# Patient Record
Sex: Male | Born: 1940 | Race: White | Hispanic: No | Smoking: Never smoker
Health system: Southern US, Community
[De-identification: ages and names within clinical notes are randomized; demographics above are authoritative.]

## PROBLEM LIST (undated history)

## (undated) DIAGNOSIS — I10 Essential (primary) hypertension: Secondary | ICD-10-CM

## (undated) DIAGNOSIS — Z45018 Encounter for adjustment and management of other part of cardiac pacemaker: Secondary | ICD-10-CM

## (undated) DIAGNOSIS — Z87442 Personal history of urinary calculi: Secondary | ICD-10-CM

## (undated) DIAGNOSIS — M549 Dorsalgia, unspecified: Secondary | ICD-10-CM

## (undated) DIAGNOSIS — E291 Testicular hypofunction: Secondary | ICD-10-CM

## (undated) DIAGNOSIS — I509 Heart failure, unspecified: Secondary | ICD-10-CM

## (undated) DIAGNOSIS — I495 Sick sinus syndrome: Secondary | ICD-10-CM

## (undated) DIAGNOSIS — E119 Type 2 diabetes mellitus without complications: Secondary | ICD-10-CM

## (undated) DIAGNOSIS — I259 Chronic ischemic heart disease, unspecified: Secondary | ICD-10-CM

## (undated) DIAGNOSIS — I455 Other specified heart block: Secondary | ICD-10-CM

## (undated) HISTORY — DX: Dorsalgia, unspecified: M54.9

## (undated) HISTORY — DX: Other specified heart block: I45.5

## (undated) HISTORY — DX: Chronic ischemic heart disease, unspecified: I25.9

## (undated) HISTORY — DX: Heart failure, unspecified: I50.9

## (undated) HISTORY — DX: Type 2 diabetes mellitus without complications: E11.9

## (undated) HISTORY — DX: Testicular hypofunction: E29.1

## (undated) HISTORY — PX: CORONARY ARTERY BYPASS GRAFT: SHX141

## (undated) HISTORY — DX: Essential (primary) hypertension: I10

---
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
## 2010-10-12 ENCOUNTER — Inpatient Hospital Stay (HOSPITAL_COMMUNITY)
Admission: RE | Admit: 2010-10-12 | Discharge: 2010-10-25 | Payer: Self-pay | Source: Home / Self Care | Attending: Cardiothoracic Surgery | Admitting: Cardiothoracic Surgery

## 2010-10-12 ENCOUNTER — Encounter: Payer: Self-pay | Admitting: Cardiothoracic Surgery

## 2010-10-26 ENCOUNTER — Encounter: Payer: Self-pay | Admitting: Internal Medicine

## 2010-10-28 ENCOUNTER — Encounter: Payer: Self-pay | Admitting: Internal Medicine

## 2010-10-28 ENCOUNTER — Ambulatory Visit: Admission: RE | Admit: 2010-10-28 | Discharge: 2010-10-28 | Payer: Self-pay | Source: Home / Self Care

## 2010-11-12 ENCOUNTER — Encounter
Admission: RE | Admit: 2010-11-12 | Discharge: 2010-11-12 | Payer: Self-pay | Source: Home / Self Care | Attending: Cardiothoracic Surgery | Admitting: Cardiothoracic Surgery

## 2010-11-12 ENCOUNTER — Ambulatory Visit
Admission: RE | Admit: 2010-11-12 | Discharge: 2010-11-12 | Payer: Self-pay | Source: Home / Self Care | Attending: Cardiothoracic Surgery | Admitting: Cardiothoracic Surgery

## 2010-11-12 NOTE — Discharge Summary (Signed)
NAMEAUGUSTO, DECKMAN NO.:  [id number]  MEDICAL RECORD NO.:  [id number]          PATIENT TYPE:  INP  LOCATION:  2016                         FACILITY:  [facility]  PHYSICIAN:  Sheliah Plane, MD    DATE OF BIRTH:  09-18-1941  DATE OF ADMISSION:  10/12/2010 DATE OF DISCHARGE:  10/22/2010                              DISCHARGE SUMMARY   ADDENDUM:  BRIEF HOSPITAL COURSE STAY:  Since last dictation, patient remained afebrile and vital signs stable.  He was found to be volume overloaded; however, he was given Lasix 40 mg IV both on October 20, 2010, and October 21, 2010.  It was then decided that we would place him on Lasix 40 mg p.o. 2 times daily for several days and then decrease it to Lasix 40 mg p.o. daily.  In addition, spironolactone and HCTZ have been discontinued.  Chest x-ray that was done on October 20, 2010, showed small bilateral pleural effusions and bibasilar atelectasis increased on the left.  In addition, the left pleural effusion was also increased. He had stable cardiomegaly and pulmonary vascular congestion. Currently, on October 21, 2010, he is afebrile.  Heart rate is in the 60s to 70s.  BP 103/62.  O2 saturation 94% on 2 L nasal cannula overnight, however, he is on room air this morning.  Preoperative weight 99 kg, today's weight questionable at 95.8 kg.  PHYSICAL EXAMINATION:  CARDIOVASCULAR:  Regular rate and rhythm, occasionally A-paced. PULMONARY:  Crackles bilaterally. ABDOMEN:  Soft, nontender.  Bowel sounds present. EXTREMITIES:  Positive lower extremity edema bilaterally.  Wounds are all clean and dry.  He has some minor swelling at the pacemaker site.  No oozing, however.  BMET that was done today revealed the potassium to be 4.7, BUN and creatinine 20 and 0.98 respectively, and the sodium was 136.  We are going to continue with the diuresis and provided the patient remains afebrile, hemodynamically stable, and is less volume  overloaded he will be surgically stable for discharge to the SNF on October 22, 2010.  FOLLOWUP APPOINTMENTS:  Include: 1. Patient needs to contact Dr. Verl Dicker office for a followup     appointment in 2 weeks. 2. Patient has an appointment to see Dr. Johney Frame on October 28, 2010, at     2 p.m.  He also has an appointment to see him on January 21, 2011, at     9:50 a.m. 3. Patient has an appointment to see Dr. Tyrone Sage on November 11, 2010,     at 11:30 a.m. and 30 minutes prior to this office appointment a     chest x-ray will be obtained. 4. Patient needs to contact his medical doctor for further diabetes     management.  DISCHARGE MEDICATIONS AT TIME OF THIS DICTATION:  Include the following: 1. Folic acid 1 mg p.o. daily. 2. Lasix 4 mg p.o. 2 times daily for 3 days, then 40 mg p.o. daily     thereafter. 3. Lisinopril 10 mg p.o. daily. 4. Lopressor 12.5 mg p.o. 2 times daily. 5. Oxycodone 5 mg 1 to 2 tabs every 4 to 6 hours as needed for pain. 6. Enteric-coated aspirin 325 mg  p.o. daily. 7. Acarbose 100 mg p.o. 3 times daily. 8. Glipizide 10 mg p.o. 2 times daily. 9. Metformin 1000 mg p.o. 2 times daily. 10.Pravastatin 40 mg p.o. at h.s. 11.Insulin glargine.  Please note patient was taking 24 units of     insulin glargine subcu every evening.  He was instructed he should     begin around 5 units subcu every evening as long as glucose will     allow and slowly titrate his insulin glargine to his preoperative     dose.     Doree Fudge, PA   ______________________________ Sheliah Plane, MD    DZ/MEDQ  D:  10/21/2010  T:  10/21/2010  Job:  [id number]  cc:   Dr. Jacinto Halim Dr. Tyrone Sage Dr. Johney Frame  Electronically Signed by Doree Fudge PA on 11/04/2010 03:24:32 PM Electronically Signed by Sheliah Plane MD on 11/12/2010 01:51:48 PM

## 2010-11-12 NOTE — Discharge Summary (Signed)
Roger Young, Roger Young NO.:  [id number]  MEDICAL RECORD NO.:  [id number]          PATIENT TYPE:  INP  LOCATION:  2016                         FACILITY:  [facility]  PHYSICIAN:  Roger Plane, MD    DATE OF BIRTH:  08/31/1941  DATE OF ADMISSION:  10/12/2010 DATE OF DISCHARGE:                              DISCHARGE SUMMARY   PRIMARY ADMITTING DIAGNOSIS:  Shortness of breath.  ADDITIONAL/DISCHARGE DIAGNOSES: 1. Coronary artery disease. 2. History of sick sinus syndrome and second degree heart block. 3. History of sleep apnea. 4. Type 2 diabetes mellitus. 5. Hyperlipidemia. 6. Postoperative renal insufficiency.  PROCEDURES PERFORMED: 1. Cardiac catheterization. 2. Coronary artery bypass grafting x5 (left internal mammary artery to     the left anterior descending, sequential saphenous vein graft to     the first and second intermediate coronary arteries, saphenous vein     graft to the distal circumflex, saphenous vein graft to the     posterior descending). 3. Endoscopic vein harvest, right leg. 4. Insertion of dual-chamber permanent pacemaker.  HISTORY:  The patient is a 70 year old male with no previous history of coronary artery disease.  He has recently been seen by Dr. Jacinto Young with a 3- to 4-week history of increasing episodes of shortness of breath.  At times, he has had orthopnea and his symptoms have been occurring with very minimal exertion.  He denies any definitive chest pain.  He underwent an echocardiogram by Dr. Jacinto Young and was found to be in second degree heart block.  He underwent Holter monitoring in November 2011 which showed frequent PVCs and junctional escape beats and Mobitz II AV block.  Because of his symptoms and his other comorbidities with increased risk for coronary artery disease, it was felt that he should undergo cardiac catheterization to further delineate his coronary anatomy.  This was performed on October 12, 2010 by Dr. Jacinto Young.   He was found to have an 80% distal left main, 80-90% right coronary artery, 70% proximal LAD with diffuse disease of the small diagonal at 90% intermediate with 80-90% disease, a 90% obtuse marginal and well- preserved ejection fraction.  Because of his high-grade left main stenosis and multivessel disease, it was felt that he should be admitted following catheterization for surgical consideration.  HOSPITAL COURSE:  Mr. Roger Young was admitted following his cardiac catheterization and was seen in consult by Dr. Sheliah Young.  His films were also reviewed and Dr. Tyrone Young agreed with the need for coronary artery bypass grafting.  He explained all risks, benefits, and alternatives of this surgery to the patient and he agreed to proceed. He was taken to the operating room on October 13, 2010 and underwent CABG x5 as described above performed by Dr. Tyrone Young.  Please see previously dictated operative report for complete details of surgery. He tolerated the procedure well and was transferred to the SICU in stable condition.  He was able to be extubated shortly after surgery. His postoperative course has been notable for continued sinus node dysfunction.  With his history of second degree AV block prior to surgery and his postoperative sinus node dysfunction, an EP consult was obtained and the patient was seen  by Dr. Hillis Young for consideration of permanent pacemaker placement.  The patient was allowed a trial in order to assess the recovery of his intrinsic heart function.  However, he continued to have bradycardia requiring pacing as well as sinus arrest episodes and continued AV block.  It was ultimately determined that he would require permanent pacemaker and a dual-chamber Medtronic pacer was implanted on October 19, 2010 by Dr. Johney Young.  Otherwise, the patient has recovered well postoperatively.  His pacemaker has been interrogated and he has now been started on low-dose beta  blocker.  He has been restarted on his oral diabetes medications and his blood sugars are improving.  He will be restarted on low-dose Lantus at the time of discharge and this may be followed up by his primary care physician. His incisions are all healing well.  He has had some volume overload postoperatively with some elevation in his B-natriuretic peptide.  He was initially started on Lasix but his BUN and creatinine experienced a slight bump.  His peak creatinine was 1.42.  Because of this, the Lasix was discontinued as was his ACE inhibitor.  Since that time, his creatinine has trended back down to baseline and he has now been restarted on low-dose lisinopril/hydrochlorothiazide which he had taken prior to surgery.  He has also been started on Aldactone for diuresis. He is ambulating in the halls with Cardiac Rehab Phase 1 and is progressing well.  His incisions are all healing well.  He has been working with PT and OT on reconditioning.  Currently, the social worker is assisting the patient in discharge planning, short-term SNF placement versus discharge to home.  His most recent labs show a sodium of 134, potassium 4.2, BUN 19, and creatinine 0.97.  His most recent CBC showed a hemoglobin of 9.0, hematocrit 27.7, white count 13.2, and platelets 242.  Chest x-ray shows pacing device in place without complication, with small bilateral pleural effusions and basilar atelectasis slightly greater on the left.  He continues to progress well and it is felt that he will hopefully be ready for discharge within the next 24 hours.  DISCHARGE MEDICATIONS: 1. Folic acid 1 mg daily. 2. Metoprolol 12.5 mg b.i.d. 3. Oxycodone IR 5-10 mg q.4-6 h. p.r.n. for pain. 4. Spirolactone 25 mg daily. 5. Enteric-coated aspirin 325 mg daily. 6. Lantus 5 units subcu at bedtime and to be increased back to the     home dose of 24 units as able. 7. Acarbose 100 mg t.i.d. 8. Glipizide 10 mg b.i.d. 9.  Lisinopril/hydrochlorothiazide 10/12.5 mg daily. 10.Metformin 1000 mg b.i.d. 11.Pravastatin 40 mg daily.  DISCHARGE INSTRUCTIONS:  He is asked to refrain from driving, heavy lifting, or strenuous activity.  He may continue ambulating daily and using his incentive spirometer.  He may shower daily and clean his incisions with soap and water.  He will continue a carbohydrate-modified medium calorie diet.  DISCHARGE FOLLOWUP:  He will need make an appointment to see Dr. Jacinto Young in 2 weeks.  He will see Dr. Tyrone Young on November 11, 2010 with a chest x- ray from [facility].  He will also need to follow up with Dr. Johney Young as directed for pacer check.  He will also need to follow up with his primary care physician regarding his diabetes medications and treatment.     Coral Ceo, P.A.   ______________________________ Roger Plane, MD    GC/MEDQ  D:  10/20/2010  T:  10/20/2010  Job:  [id number]  cc:  Hillis Range, MD Cristy Hilts. Roger Halim, MD Aida Puffer, M.D. [facility] Office  Electronically Signed by Weldon Inches. on 10/28/2010 10:52:40 AM Electronically Signed by Roger Plane MD on 11/12/2010 01:51:41 PM

## 2010-11-12 NOTE — Discharge Summary (Signed)
  NAMEDACOTAH, CABELLO NO.:  [id number]  MEDICAL RECORD NO.:  [id number]          PATIENT TYPE:  INP  LOCATION:  2016                         FACILITY:  [facility]  PHYSICIAN:  Sheliah Plane, MD    DATE OF BIRTH:  [birth year]  DATE OF ADMISSION:  10/12/2010 DATE OF DISCHARGE:                              DISCHARGE SUMMARY   ADDENDUM  This is an addendum to a previously dictated discharge summary.  Mr. Menna was initially scheduled for transfer to Skilled Nursing Facility on October 22, 2010.  However on morning round evaluation, he was noted to still be significantly volume overloaded requiring further diuresis.  He was continued on Lasix 40 mg IV b.i.d. as well as Zaroxolyn 5 mg daily.  Over the course of the next several days, his volume overload did significantly improve.  He continues to remain edematous on physical exam but this has markedly decreased.  Also a followup chest x-ray was performed to reevaluate bilateral pleural effusions and these too have improved with diuresis.  He remained stable otherwise.  His most recent labs show a sodium of 135, potassium 3.8, BUN 23, creatinine 1.13.  His blood sugars have remained stable on oral medications alone and for this reason his Lantus has not been restarted. He was on 24 units nightly prior to admission and at the present time his blood sugars have been running in the 130s-150 range.  It was felt that he will not require his insulin at the time of discharge and this can be readdressed as an outpatient.  It is anticipated that if he remains stable over the next 24 hours, he will be ready for transfer to SNF on Monday January 2.  DISCHARGE MEDICATIONS: 1. Folic acid 1 mg daily. 2. Lasix 40 mg daily. 3. Potassium 20 mEq daily. 4. Combivent 2 puffs q.6 h. 5. Lisinopril 10 mg daily. 6. Lopressor 12.5 mg b.i.d. 7. Oxycodone IR 5 mg 1-2 q.4-6 h. p.r.n. for pain. 8. Enteric-coated aspirin 325 mg  daily. 9. Acarbose 200 mg t.i.d. 10.Glipizide 10 mg b.i.d. 11.Metformin 1000 mg b.i.d. 12.Pravastatin 40 mg daily.  Discharge instructions and followup appointments are unchanged from the previously dictated discharge summary.     Coral Ceo, P.A.   ______________________________ Sheliah Plane, MD    GC/MEDQ  D:  10/24/2010  T:  10/25/2010  Job:  [id number]  cc:   [facility] OFFICE Cristy Hilts. Jacinto Halim, MD Hillis Range, MD  Electronically Signed by Weldon Inches. on 10/28/2010 10:53:31 AM Electronically Signed by Sheliah Plane MD on 11/12/2010 01:51:45 PM

## 2010-11-13 NOTE — Assessment & Plan Note (Signed)
OFFICE VISIT  EDDER, BELLANCA DOB:  03-15-1941                                        November 12, 2010 CHART #:  [id number]  Mr. Lucarelli returns to the office today in followup after his urgent coronary artery bypass grafting on October 13, 2010 and subsequent placement of a permanent pacemaker.  Overall he is making very good progress postoperatively, he has increased his physical activity appropriately.  He has had no recurrent angina.  He notes that his overall respiratory status is much improved.  He denies the nocturnal dyspnea that he was previously noticing.  On exam, his blood pressure 115/72, pulse 80, respiratory rate is 20, and O2 sats 98%.  His sternum is stable and well healed.  Bowel sounds are crisp.  The left pacer site is well healed without evidence of infection or drainage.  He has very mild right calf tenderness at the endo-vein harvest site, none on the left.  He has no pedal edema.  Followup chest x-ray shows clear lung fields bilaterally.  His medications have changed slightly.  His Dr. Jacinto Halim was placed him on Plavix 75 mg a day and decreased his aspirin to 81 mg a day.  He continues on Lasix, potassium, Combivent, lisinopril, Lopressor, acarbose 200 b.i.d., glyburide, metformin, and pravastatin.  Overall I am very pleased with his progress.  I have not made him return appointment to see me but would be glad to see him at his or Dr. Verl Dicker request at anytime.  Sheliah Plane, MD Electronically Signed  EG/MEDQ  D:  11/12/2010  T:  11/13/2010  Job:  [id number]  cc:   Cristy Hilts. Jacinto Halim, MD Aida Puffer

## 2010-11-24 ENCOUNTER — Encounter: Payer: Self-pay | Admitting: Cardiothoracic Surgery

## 2010-11-25 NOTE — Procedures (Signed)
Summary: wch. gd   Current Medications (verified): 1)  Folic Acid 1 Mg Tabs (Folic Acid) .... One By Mouth Daily 2)  Metoprolol Succinate 25 Mg Xr24h-Tab (Metoprolol Succinate) .... One By Mouth Daily 3)  Ecotrin 325 Mg Tbec (Aspirin) .... One By Mouth Daily 4)  Acarbose 100 Mg Tabs (Acarbose) .... One By Mouth Three Times A Day 5)  Glipizide Xl 10 Mg Xr24h-Tab (Glipizide) .... One By Mouth Two Times A Day 6)  Metformin Hcl 1000 Mg Tabs (Metformin Hcl) .... One By Mouth Two Times A Day 7)  Pravastatin Sodium 40 Mg Tabs (Pravastatin Sodium) .... One By Mouth Daily 8)  Lasix 40 Mg Tabs (Furosemide) .... One By Mouth Daily 9)  Klor-Con 20 Meq Cr-Tabs (Potassium Chloride) .... One By Mouth Daily 10)  Prinivil 10 Mg Tabs (Lisinopril) .... One By Mouth Daily 11)  Combivent 18-103 Mcg/act Aero (Ipratropium-Albuterol) .... 2 Puffs Every 6 Hours  Allergies (verified): No Known Drug Allergies  PPM Specifications Following MD:  Hillis Range, MD     PPM Vendor:  Medtronic     PPM Model Number:  [facility]     PPM Serial Number:  [id number] PPM DOI:  10/19/2010     PPM Implanting MD:  Hillis Range, MD  Lead 1    Location: RA     DOI: 10/19/2010     Model #: 9147     Serial #: [id number]     Status: active Lead 2    Location: RV     DOI: 10/19/2010     Model #: 6578     Serial #: [id number]     Status: active  Magnet Response Rate:  BOL 85 ERI 65  Indications:  Sick sinus syndrome   PPM Follow Up Remote Check?  No Battery Voltage:  2.78 V     Battery Est. Longevity:  13 years     Pacer Dependent:  No       PPM Device Measurements [employer]rium  Amplitude: 4.0 mV, Impedance: 558 ohms, Threshold: 0.5 V at 0.4 msec Right Ventricle  Amplitude: 22.40 mV, Impedance: 657 ohms, Threshold: 0.25 V at 0.4 msec  Episodes MS Episodes:  0     Percent Mode Switch:  0     Coumadin:  No Ventricular High Rate:  0     Atrial Pacing:  17.7%     Ventricular Pacing:  0.6%  Parameters Mode:  DDDR+     Lower Rate Limit:   60     Upper Rate Limit:  120 Paced AV Delay:  180     Sensed AV Delay:  150 Next Cardiology Appt Due:  01/21/2011 Tech Comments:  Steri strips removed, no redness but a moderate amt. of edema noted.  Mr. Daubert  was on Lovenox in the hospital.  No parameter changes.  Device function normal.  ROV 3 months with Dr. Johney Frame. Altha Harm, LPN  October 28, 2010 2:38 PM

## 2010-11-25 NOTE — Miscellaneous (Signed)
Summary: Device preload  Clinical Lists Changes  Observations: Added new observation of PPM INDICATN: Sick sinus syndrome (10/26/2010 14:02) Added new observation of MAGNET RTE: BOL 85 ERI 65 (10/26/2010 14:02) Added new observation of PPMLEADSTAT2: active (10/26/2010 14:02) Added new observation of PPMLEADSER2: [id number] (10/26/2010 14:02) Added new observation of PPMLEADMOD2: 5076  (10/26/2010 14:02) Added new observation of PPMLEADLOC2: RV  (10/26/2010 14:02) Added new observation of PPMLEADSTAT1: active  (10/26/2010 14:02) Added new observation of PPMLEADSER1: [id number]  (10/26/2010 14:02) Added new observation of PPMLEADMOD1: 5076  (10/26/2010 14:02) Added new observation of PPMLEADLOC1: RA  (10/26/2010 14:02) Added new observation of PPM IMP MD: Hillis Range, MD  (10/26/2010 14:02) Added new observation of PPMLEADDOI2: 10/19/2010  (10/26/2010 14:02) Added new observation of PPMLEADDOI1: 10/19/2010  (10/26/2010 14:02) Added new observation of PPM DOI: 10/19/2010  (10/26/2010 14:02) Added new observation of PPM SERL#: [id number]  (10/26/2010 14:02) Added new observation of PPM MODL#: [id number]  (10/26/2010 14:02) Added new observation of PACEMAKERMFG: Medtronic  (10/26/2010 14:02) Added new observation of PACEMAKER MD: Hillis Range, MD  (10/26/2010 14:02)      PPM Specifications Following MD:  Hillis Range, MD     PPM Vendor:  Medtronic     PPM Model Number:  [id number]     PPM Serial Number:  [id number] PPM DOI:  10/19/2010     PPM Implanting MD:  Hillis Range, MD  Lead 1    Location: RA     DOI: 10/19/2010     Model #: [id number]     Serial #: [id number]     Status: active Lead 2    Location: RV     DOI: 10/19/2010     Model #: [id number]     Serial #: [id number]     Status: active  Magnet Response Rate:  BOL 85 ERI 65  Indications:  Sick sinus syndrome

## 2010-11-25 NOTE — Cardiovascular Report (Signed)
Summary: Office Visit   Office Visit   Imported By: Roderic Ovens 11/02/2010 14:05:02  _____________________________________________________________________  External Attachment:    Type:   Image     Comment:   External Document

## 2010-12-31 ENCOUNTER — Ambulatory Visit (INDEPENDENT_AMBULATORY_CARE_PROVIDER_SITE_OTHER): Payer: Self-pay | Admitting: Cardiothoracic Surgery

## 2010-12-31 DIAGNOSIS — I2510 Atherosclerotic heart disease of native coronary artery without angina pectoris: Secondary | ICD-10-CM

## 2011-01-01 NOTE — Assessment & Plan Note (Signed)
OFFICE VISIT  Roger Young, Roger Young DOB:  1941-02-12                                        December 31, 2010 CHART #:  [id number]  CURRENT PROBLEMS: 1. Status post coronary artery bypass graft x5, October 13, 2010, by     Dr. Tyrone Sage for severe left main and multivessel coronary artery     disease. 2. Mobitz II heart block following surgery requiring permanent     pacemaker. 3. Cellulitis of the upper portion the sternal incision. 4. 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  PRESENT ILLNESS:  The patient reports the office today over concern of his sternal incision and itching rash over his right lower leg.  He is back to work after multivessel bypass grafting in late December by Dr. Tyrone Sage.  He is having no angina, and he has no symptoms of CHF.  His medications have been adjusted by Dr. Jacinto Halim and currently, include lisinopril, Lopressor, aspirin, metformin, pravastatin, Plavix, and he has not been given any antibiotics.  PHYSICAL EXAMINATION:  VITAL SIGNS:  Blood pressure 150/80, pulse 80, saturation 90%, temperature 97.2.  GENERAL:  He is alert and pleasant. Breath sounds are clear.  The sternum is stable.  HEART:  Rhythm is regular without rub or gallop.  The upper portion of the sternal incision is intact, but there is a dry eschar and some mild surrounding erythema and induration.  This is mildly tender.  There is no fluctuance, drainage, or evidence of a space underneath the skin.  This is very small and measures 1 cm x 1 cm.  The lower incision has no problems.  EXTREMITIES:  The lower leg incision over the vein was harvested above the ankle has some eczema - rash.  There is no edema.  PLAN:  The patient probably has mild cellulitis or suture granuloma in the sternal incision.  He will be given a course of oral Keflex.  He has a rash - eczema of his lower leg, and he will be given some topical steroid cream.  He is also complaining  of generalized pruritus or itching, and I gave him a short prescription for some Atarax.  He will return to see Dr. Tyrone Sage in 2-3 weeks after he finishes course of antibiotics for this probable suture granuloma with some mild surrounding cellulitis.  Kerin Perna, M.D. Electronically Signed  PV/MEDQ  D:  12/31/2010  T:  01/01/2011  Job:  [id number]  cc:   Cristy Hilts. Jacinto Halim, MD

## 2011-01-03 LAB — GLUCOSE, CAPILLARY
Glucose-Capillary: 100 mg/dL — ABNORMAL HIGH (ref 70–99)
Glucose-Capillary: 101 mg/dL — ABNORMAL HIGH (ref 70–99)
Glucose-Capillary: 102 mg/dL — ABNORMAL HIGH (ref 70–99)
Glucose-Capillary: 105 mg/dL — ABNORMAL HIGH (ref 70–99)
Glucose-Capillary: 106 mg/dL — ABNORMAL HIGH (ref 70–99)
Glucose-Capillary: 111 mg/dL — ABNORMAL HIGH (ref 70–99)
Glucose-Capillary: 111 mg/dL — ABNORMAL HIGH (ref 70–99)
Glucose-Capillary: 115 mg/dL — ABNORMAL HIGH (ref 70–99)
Glucose-Capillary: 115 mg/dL — ABNORMAL HIGH (ref 70–99)
Glucose-Capillary: 118 mg/dL — ABNORMAL HIGH (ref 70–99)
Glucose-Capillary: 118 mg/dL — ABNORMAL HIGH (ref 70–99)
Glucose-Capillary: 119 mg/dL — ABNORMAL HIGH (ref 70–99)
Glucose-Capillary: 120 mg/dL — ABNORMAL HIGH (ref 70–99)
Glucose-Capillary: 122 mg/dL — ABNORMAL HIGH (ref 70–99)
Glucose-Capillary: 128 mg/dL — ABNORMAL HIGH (ref 70–99)
Glucose-Capillary: 133 mg/dL — ABNORMAL HIGH (ref 70–99)
Glucose-Capillary: 138 mg/dL — ABNORMAL HIGH (ref 70–99)
Glucose-Capillary: 143 mg/dL — ABNORMAL HIGH (ref 70–99)
Glucose-Capillary: 146 mg/dL — ABNORMAL HIGH (ref 70–99)
Glucose-Capillary: 146 mg/dL — ABNORMAL HIGH (ref 70–99)
Glucose-Capillary: 146 mg/dL — ABNORMAL HIGH (ref 70–99)
Glucose-Capillary: 151 mg/dL — ABNORMAL HIGH (ref 70–99)
Glucose-Capillary: 151 mg/dL — ABNORMAL HIGH (ref 70–99)
Glucose-Capillary: 153 mg/dL — ABNORMAL HIGH (ref 70–99)
Glucose-Capillary: 154 mg/dL — ABNORMAL HIGH (ref 70–99)
Glucose-Capillary: 155 mg/dL — ABNORMAL HIGH (ref 70–99)
Glucose-Capillary: 156 mg/dL — ABNORMAL HIGH (ref 70–99)
Glucose-Capillary: 188 mg/dL — ABNORMAL HIGH (ref 70–99)
Glucose-Capillary: 210 mg/dL — ABNORMAL HIGH (ref 70–99)
Glucose-Capillary: 216 mg/dL — ABNORMAL HIGH (ref 70–99)
Glucose-Capillary: 216 mg/dL — ABNORMAL HIGH (ref 70–99)
Glucose-Capillary: 65 mg/dL — ABNORMAL LOW (ref 70–99)
Glucose-Capillary: 67 mg/dL — ABNORMAL LOW (ref 70–99)
Glucose-Capillary: 74 mg/dL (ref 70–99)
Glucose-Capillary: 81 mg/dL (ref 70–99)
Glucose-Capillary: 92 mg/dL (ref 70–99)

## 2011-01-03 LAB — POCT I-STAT 3, ART BLOOD GAS (G3+)
Acid-base deficit: 5 mmol/L — ABNORMAL HIGH (ref 0.0–2.0)
Bicarbonate: 19.5 mEq/L — ABNORMAL LOW (ref 20.0–24.0)
Bicarbonate: 23.8 mEq/L (ref 20.0–24.0)
Bicarbonate: 25.3 mEq/L — ABNORMAL HIGH (ref 20.0–24.0)
Bicarbonate: 27.3 mEq/L — ABNORMAL HIGH (ref 20.0–24.0)
O2 Saturation: 100 %
O2 Saturation: 79 %
O2 Saturation: 96 %
Patient temperature: 37.5
TCO2: 25 mmol/L (ref 0–100)
TCO2: 29 mmol/L (ref 0–100)
pCO2 arterial: 41.3 mmHg (ref 35.0–45.0)
pCO2 arterial: 44.6 mmHg (ref 35.0–45.0)
pCO2 arterial: 46.5 mmHg — ABNORMAL HIGH (ref 35.0–45.0)
pCO2 arterial: 48.4 mmHg — ABNORMAL HIGH (ref 35.0–45.0)
pH, Arterial: 7.307 — ABNORMAL LOW (ref 7.350–7.450)
pH, Arterial: 7.334 — ABNORMAL LOW (ref 7.350–7.450)
pH, Arterial: 7.359 (ref 7.350–7.450)
pH, Arterial: 7.359 (ref 7.350–7.450)
pO2, Arterial: 344 mmHg — ABNORMAL HIGH (ref 80.0–100.0)
pO2, Arterial: 368 mmHg — ABNORMAL HIGH (ref 80.0–100.0)
pO2, Arterial: 43 mmHg — ABNORMAL LOW (ref 80.0–100.0)
pO2, Arterial: 89 mmHg (ref 80.0–100.0)

## 2011-01-03 LAB — COMPREHENSIVE METABOLIC PANEL
ALT: 18 U/L (ref 0–53)
AST: 26 U/L (ref 0–37)
Albumin: 3.4 g/dL — ABNORMAL LOW (ref 3.5–5.2)
BUN: 26 mg/dL — ABNORMAL HIGH (ref 6–23)
GFR calc Af Amer: >60 mL/min (ref >60)
GFR calc non Af Amer: >60 mL/min (ref >60)
Glucose, Bld: 189 mg/dL — ABNORMAL HIGH (ref 70–99)
Sodium: 136 mEq/L (ref 135–145)

## 2011-01-03 LAB — BASIC METABOLIC PANEL
BUN: 19 mg/dL (ref 6–23)
BUN: 19 mg/dL (ref 6–23)
BUN: 20 mg/dL (ref 6–23)
BUN: 20 mg/dL (ref 6–23)
BUN: 22 mg/dL (ref 6–23)
BUN: 22 mg/dL (ref 6–23)
BUN: 23 mg/dL (ref 6–23)
BUN: 23 mg/dL (ref 6–23)
BUN: 27 mg/dL — ABNORMAL HIGH (ref 6–23)
BUN: 29 mg/dL — ABNORMAL HIGH (ref 6–23)
BUN: 35 mg/dL — ABNORMAL HIGH (ref 6–23)
BUN: 36 mg/dL — ABNORMAL HIGH (ref 6–23)
CO2: 22 mEq/L (ref 19–32)
CO2: 23 mEq/L (ref 19–32)
CO2: 25 mEq/L (ref 19–32)
CO2: 26 mEq/L (ref 19–32)
CO2: 27 mEq/L (ref 19–32)
CO2: 27 mEq/L (ref 19–32)
CO2: 27 mEq/L (ref 19–32)
CO2: 27 mEq/L (ref 19–32)
CO2: 27 mEq/L (ref 19–32)
CO2: 28 mEq/L (ref 19–32)
CO2: 28 mEq/L (ref 19–32)
CO2: 29 mEq/L (ref 19–32)
Calcium: 8.7 mg/dL (ref 8.4–10.5)
Calcium: 8.8 mg/dL (ref 8.4–10.5)
Calcium: 8.9 mg/dL (ref 8.4–10.5)
Calcium: 9 mg/dL (ref 8.4–10.5)
Calcium: 9 mg/dL (ref 8.4–10.5)
Calcium: 9.1 mg/dL (ref 8.4–10.5)
Calcium: 9.3 mg/dL (ref 8.4–10.5)
Calcium: 9.3 mg/dL (ref 8.4–10.5)
Calcium: 9.4 mg/dL (ref 8.4–10.5)
Calcium: 9.4 mg/dL (ref 8.4–10.5)
Calcium: 9.5 mg/dL (ref 8.4–10.5)
Chloride: 100 mEq/L (ref 96–112)
Chloride: 105 mEq/L (ref 96–112)
Chloride: 94 mEq/L — ABNORMAL LOW (ref 96–112)
Chloride: 99 mEq/L (ref 96–112)
Chloride: 99 mEq/L (ref 96–112)
Chloride: 99 mEq/L (ref 96–112)
Chloride: 99 mEq/L (ref 96–112)
Chloride: 99 mEq/L (ref 96–112)
Chloride: 99 mEq/L (ref 96–112)
Creatinine, Ser: 0.84 mg/dL (ref 0.4–1.5)
Creatinine, Ser: 0.87 mg/dL (ref 0.4–1.5)
Creatinine, Ser: 0.97 mg/dL (ref 0.4–1.5)
Creatinine, Ser: 0.98 mg/dL (ref 0.4–1.5)
Creatinine, Ser: 1.13 mg/dL (ref 0.4–1.5)
Creatinine, Ser: 1.14 mg/dL (ref 0.4–1.5)
Creatinine, Ser: 1.17 mg/dL (ref 0.4–1.5)
Creatinine, Ser: 1.17 mg/dL (ref 0.4–1.5)
Creatinine, Ser: 1.19 mg/dL (ref 0.4–1.5)
Creatinine, Ser: 1.32 mg/dL (ref 0.4–1.5)
Creatinine, Ser: 1.4 mg/dL (ref 0.4–1.5)
Creatinine, Ser: 1.42 mg/dL (ref 0.4–1.5)
GFR calc Af Amer: 60 mL/min — ABNORMAL LOW (ref >60)
GFR calc Af Amer: >60 mL/min (ref >60)
GFR calc Af Amer: >60 mL/min (ref >60)
GFR calc Af Amer: >60 mL/min (ref >60)
GFR calc Af Amer: >60 mL/min (ref >60)
GFR calc Af Amer: >60 mL/min (ref >60)
GFR calc Af Amer: >60 mL/min (ref >60)
GFR calc Af Amer: >60 mL/min (ref >60)
GFR calc Af Amer: >60 mL/min (ref >60)
GFR calc Af Amer: >60 mL/min (ref >60)
GFR calc Af Amer: >60 mL/min (ref >60)
GFR calc non Af Amer: 50 mL/min — ABNORMAL LOW (ref >60)
GFR calc non Af Amer: 50 mL/min — ABNORMAL LOW (ref >60)
GFR calc non Af Amer: 54 mL/min — ABNORMAL LOW (ref >60)
GFR calc non Af Amer: >60 mL/min (ref >60)
GFR calc non Af Amer: >60 mL/min (ref >60)
GFR calc non Af Amer: >60 mL/min (ref >60)
GFR calc non Af Amer: >60 mL/min (ref >60)
GFR calc non Af Amer: >60 mL/min (ref >60)
GFR calc non Af Amer: >60 mL/min (ref >60)
GFR calc non Af Amer: >60 mL/min (ref >60)
GFR calc non Af Amer: >60 mL/min (ref >60)
GFR calc non Af Amer: >60 mL/min (ref >60)
GFR calc non Af Amer: >60 mL/min (ref >60)
Glucose, Bld: 115 mg/dL — ABNORMAL HIGH (ref 70–99)
Glucose, Bld: 119 mg/dL — ABNORMAL HIGH (ref 70–99)
Glucose, Bld: 121 mg/dL — ABNORMAL HIGH (ref 70–99)
Glucose, Bld: 126 mg/dL — ABNORMAL HIGH (ref 70–99)
Glucose, Bld: 138 mg/dL — ABNORMAL HIGH (ref 70–99)
Glucose, Bld: 142 mg/dL — ABNORMAL HIGH (ref 70–99)
Glucose, Bld: 153 mg/dL — ABNORMAL HIGH (ref 70–99)
Glucose, Bld: 158 mg/dL — ABNORMAL HIGH (ref 70–99)
Glucose, Bld: 164 mg/dL — ABNORMAL HIGH (ref 70–99)
Glucose, Bld: 172 mg/dL — ABNORMAL HIGH (ref 70–99)
Glucose, Bld: 71 mg/dL (ref 70–99)
Glucose, Bld: 83 mg/dL (ref 70–99)
Glucose, Bld: 84 mg/dL (ref 70–99)
Potassium: 3.8 mEq/L (ref 3.5–5.1)
Potassium: 3.9 mEq/L (ref 3.5–5.1)
Potassium: 3.9 mEq/L (ref 3.5–5.1)
Potassium: 4.5 mEq/L (ref 3.5–5.1)
Potassium: 4.6 mEq/L (ref 3.5–5.1)
Potassium: 4.6 mEq/L (ref 3.5–5.1)
Potassium: 4.7 mEq/L (ref 3.5–5.1)
Potassium: 4.7 mEq/L (ref 3.5–5.1)
Potassium: 5.6 mEq/L — ABNORMAL HIGH (ref 3.5–5.1)
Sodium: 130 mEq/L — ABNORMAL LOW (ref 135–145)
Sodium: 130 mEq/L — ABNORMAL LOW (ref 135–145)
Sodium: 130 mEq/L — ABNORMAL LOW (ref 135–145)
Sodium: 132 mEq/L — ABNORMAL LOW (ref 135–145)
Sodium: 133 mEq/L — ABNORMAL LOW (ref 135–145)
Sodium: 134 mEq/L — ABNORMAL LOW (ref 135–145)
Sodium: 135 mEq/L (ref 135–145)
Sodium: 136 mEq/L (ref 135–145)
Sodium: 136 mEq/L (ref 135–145)
Sodium: 137 mEq/L (ref 135–145)

## 2011-01-03 LAB — CBC
HCT: 26 % — ABNORMAL LOW (ref 39.0–52.0)
HCT: 27.7 % — ABNORMAL LOW (ref 39.0–52.0)
HCT: 27.7 % — ABNORMAL LOW (ref 39.0–52.0)
HCT: 27.9 % — ABNORMAL LOW (ref 39.0–52.0)
HCT: 28.1 % — ABNORMAL LOW (ref 39.0–52.0)
HCT: 28.9 % — ABNORMAL LOW (ref 39.0–52.0)
HCT: 32.8 % — ABNORMAL LOW (ref 39.0–52.0)
HCT: 37.7 % — ABNORMAL LOW (ref 39.0–52.0)
Hemoglobin: 10.8 g/dL — ABNORMAL LOW (ref 13.0–17.0)
Hemoglobin: 12.8 g/dL — ABNORMAL LOW (ref 13.0–17.0)
Hemoglobin: 12.9 g/dL — ABNORMAL LOW (ref 13.0–17.0)
Hemoglobin: 8.7 g/dL — ABNORMAL LOW (ref 13.0–17.0)
Hemoglobin: 9 g/dL — ABNORMAL LOW (ref 13.0–17.0)
Hemoglobin: 9.2 g/dL — ABNORMAL LOW (ref 13.0–17.0)
Hemoglobin: 9.2 g/dL — ABNORMAL LOW (ref 13.0–17.0)
Hemoglobin: 9.3 g/dL — ABNORMAL LOW (ref 13.0–17.0)
Hemoglobin: 9.8 g/dL — ABNORMAL LOW (ref 13.0–17.0)
MCH: 28 pg (ref 26.0–34.0)
MCH: 28.2 pg (ref 26.0–34.0)
MCH: 28.5 pg (ref 26.0–34.0)
MCH: 28.6 pg (ref 26.0–34.0)
MCH: 28.6 pg (ref 26.0–34.0)
MCH: 28.7 pg (ref 26.0–34.0)
MCH: 28.8 pg (ref 26.0–34.0)
MCH: 29.4 pg (ref 26.0–34.0)
MCHC: 32.5 g/dL (ref 30.0–36.0)
MCHC: 32.9 g/dL (ref 30.0–36.0)
MCHC: 33 g/dL (ref 30.0–36.0)
MCHC: 33.1 g/dL (ref 30.0–36.0)
MCHC: 33.1 g/dL (ref 30.0–36.0)
MCHC: 33.2 g/dL (ref 30.0–36.0)
MCHC: 33.5 g/dL (ref 30.0–36.0)
MCHC: 33.9 g/dL (ref 30.0–36.0)
MCHC: 34 g/dL (ref 30.0–36.0)
MCV: 85 fL (ref 78.0–100.0)
MCV: 85.5 fL (ref 78.0–100.0)
MCV: 85.6 fL (ref 78.0–100.0)
MCV: 86 fL (ref 78.0–100.0)
MCV: 86 fL (ref 78.0–100.0)
MCV: 86.2 fL (ref 78.0–100.0)
MCV: 86.5 fL (ref 78.0–100.0)
MCV: 86.9 fL (ref 78.0–100.0)
Platelets: 146 10*3/uL — ABNORMAL LOW (ref 150–400)
Platelets: 152 10*3/uL (ref 150–400)
Platelets: 159 10*3/uL (ref 150–400)
Platelets: 167 10*3/uL (ref 150–400)
Platelets: 169 10*3/uL (ref 150–400)
Platelets: 186 10*3/uL (ref 150–400)
Platelets: 242 10*3/uL (ref 150–400)
Platelets: 253 10*3/uL (ref 150–400)
RBC: 3.04 MIL/uL — ABNORMAL LOW (ref 4.22–5.81)
RBC: 3.21 MIL/uL — ABNORMAL LOW (ref 4.22–5.81)
RBC: 3.22 MIL/uL — ABNORMAL LOW (ref 4.22–5.81)
RBC: 3.22 MIL/uL — ABNORMAL LOW (ref 4.22–5.81)
RBC: 3.26 MIL/uL — ABNORMAL LOW (ref 4.22–5.81)
RBC: 3.4 MIL/uL — ABNORMAL LOW (ref 4.22–5.81)
RBC: 3.83 MIL/uL — ABNORMAL LOW (ref 4.22–5.81)
RDW: 13 % (ref 11.5–15.5)
RDW: 13 % (ref 11.5–15.5)
RDW: 13.2 % (ref 11.5–15.5)
RDW: 13.4 % (ref 11.5–15.5)
RDW: 13.4 % (ref 11.5–15.5)
RDW: 13.5 % (ref 11.5–15.5)
RDW: 13.6 % (ref 11.5–15.5)
RDW: 13.6 % (ref 11.5–15.5)
WBC: 12.5 10*3/uL — ABNORMAL HIGH (ref 4.0–10.5)
WBC: 13.2 10*3/uL — ABNORMAL HIGH (ref 4.0–10.5)
WBC: 13.2 10*3/uL — ABNORMAL HIGH (ref 4.0–10.5)
WBC: 14.5 10*3/uL — ABNORMAL HIGH (ref 4.0–10.5)
WBC: 15.5 10*3/uL — ABNORMAL HIGH (ref 4.0–10.5)
WBC: 16.4 10*3/uL — ABNORMAL HIGH (ref 4.0–10.5)
WBC: 18.7 10*3/uL — ABNORMAL HIGH (ref 4.0–10.5)
WBC: 9.2 10*3/uL (ref 4.0–10.5)

## 2011-01-03 LAB — POCT I-STAT, CHEM 8
BUN: 23 mg/dL (ref 6–23)
Calcium, Ion: 1.25 mmol/L (ref 1.12–1.32)
Calcium, Ion: 1.3 mmol/L (ref 1.12–1.32)
Chloride: 105 mEq/L (ref 96–112)
Chloride: 99 mEq/L (ref 96–112)
Creatinine, Ser: 1.1 mg/dL (ref 0.4–1.5)
Glucose, Bld: 103 mg/dL — ABNORMAL HIGH (ref 70–99)
Potassium: 5 mEq/L (ref 3.5–5.1)

## 2011-01-03 LAB — T4, FREE: Free T4: 0.91 ng/dL (ref 0.80–1.80)

## 2011-01-03 LAB — BRAIN NATRIURETIC PEPTIDE: Pro B Natriuretic peptide (BNP): 326 pg/mL — ABNORMAL HIGH (ref 0.0–100.0)

## 2011-01-03 LAB — PLATELET FUNCTION ASSAY: Collagen / Epinephrine: 165 seconds (ref 0–184)

## 2011-01-03 LAB — TSH: TSH: 2.958 u[IU]/mL (ref 0.350–4.500)

## 2011-01-03 LAB — POCT I-STAT 4, (NA,K, GLUC, HGB,HCT)
Glucose, Bld: 130 mg/dL — ABNORMAL HIGH (ref 70–99)
Glucose, Bld: 84 mg/dL (ref 70–99)
Glucose, Bld: 90 mg/dL (ref 70–99)
Glucose, Bld: 97 mg/dL (ref 70–99)
HCT: 28 % — ABNORMAL LOW (ref 39.0–52.0)
HCT: 33 % — ABNORMAL LOW (ref 39.0–52.0)
Hemoglobin: 10.9 g/dL — ABNORMAL LOW (ref 13.0–17.0)
Hemoglobin: 11.2 g/dL — ABNORMAL LOW (ref 13.0–17.0)
Hemoglobin: 12.9 g/dL — ABNORMAL LOW (ref 13.0–17.0)
Potassium: 3.9 mEq/L (ref 3.5–5.1)
Potassium: 4.3 mEq/L (ref 3.5–5.1)
Potassium: 4.4 mEq/L (ref 3.5–5.1)
Sodium: 135 mEq/L (ref 135–145)
Sodium: 137 mEq/L (ref 135–145)
Sodium: 138 mEq/L (ref 135–145)

## 2011-01-03 LAB — URINALYSIS, ROUTINE W REFLEX MICROSCOPIC
Bilirubin Urine: NEGATIVE
Hgb urine dipstick: NEGATIVE
Ketones, ur: NEGATIVE mg/dL
Protein, ur: NEGATIVE mg/dL
Specific Gravity, Urine: 1.019 (ref 1.005–1.030)
pH: 5.5 (ref 5.0–8.0)

## 2011-01-03 LAB — TYPE AND SCREEN
ABO/RH(D): A POS
Antibody Screen: NEGATIVE

## 2011-01-03 LAB — BLOOD GAS, ARTERIAL
Acid-base deficit: 0.8 mmol/L (ref 0.0–2.0)
Patient temperature: 98.6
TCO2: 24 mmol/L (ref 0–100)
pCO2 arterial: 34.9 mmHg — ABNORMAL LOW (ref 35.0–45.0)
pH, Arterial: 7.433 (ref 7.350–7.450)

## 2011-01-03 LAB — HEMOGLOBIN A1C
Hgb A1c MFr Bld: 7.5 % — ABNORMAL HIGH (ref <5.7)
Hgb A1c MFr Bld: 7.5 % — ABNORMAL HIGH (ref <5.7)
Mean Plasma Glucose: 169 mg/dL — ABNORMAL HIGH (ref <117)

## 2011-01-03 LAB — MAGNESIUM
Magnesium: 2 mg/dL (ref 1.5–2.5)
Magnesium: 2.3 mg/dL (ref 1.5–2.5)
Magnesium: 2.6 mg/dL — ABNORMAL HIGH (ref 1.5–2.5)

## 2011-01-03 LAB — PROTIME-INR
INR: 1.45 (ref 0.00–1.49)
Prothrombin Time: 17.8 seconds — ABNORMAL HIGH (ref 11.6–15.2)

## 2011-01-03 LAB — MRSA PCR SCREENING: MRSA by PCR: NEGATIVE

## 2011-01-03 LAB — APTT: aPTT: 35 seconds (ref 24–37)

## 2011-01-03 LAB — CREATININE, SERUM
Creatinine, Ser: 1.01 mg/dL (ref 0.4–1.5)
GFR calc Af Amer: >60 mL/min (ref >60)
GFR calc non Af Amer: >60 mL/min (ref >60)

## 2011-01-03 LAB — HEMOGLOBIN AND HEMATOCRIT, BLOOD
HCT: 28 % — ABNORMAL LOW (ref 39.0–52.0)
Hemoglobin: 9.4 g/dL — ABNORMAL LOW (ref 13.0–17.0)

## 2011-01-03 LAB — PLATELET COUNT: Platelets: 183 10*3/uL (ref 150–400)

## 2011-01-20 ENCOUNTER — Encounter (INDEPENDENT_AMBULATORY_CARE_PROVIDER_SITE_OTHER): Payer: Medicare Other | Admitting: Cardiothoracic Surgery

## 2011-01-20 ENCOUNTER — Encounter: Payer: Medicare Other | Admitting: Cardiothoracic Surgery

## 2011-01-20 DIAGNOSIS — I2510 Atherosclerotic heart disease of native coronary artery without angina pectoris: Secondary | ICD-10-CM

## 2011-01-20 NOTE — Assessment & Plan Note (Signed)
OFFICE VISIT  Roger Young, Roger Young DOB:  01-17-1941                                        January 20, 2011 CHART #:  [id number]  The patient returns to the office today.  He was originally underwent coronary artery bypass grafting x5 on October 13, 2010.  He came back in early March and saw Dr. Donata Clay, because of itching and diffuse pruritus and area of cellulitis in upper portion of the sternal incision.  He was treated with Keflex and he was also given Atarax cream for the itching, this is all improved and he returns tot the office for followup check of his wound.  He does note that his last hemoglobin A1c was 5.3, but since then has had numerous changes in his diabetic medication and notes his glucoses now running higher.  He also says decided to stop taking Plavix because he ran out of it and switch as prescribed by Dr. Jacinto Halim.  At the time, he was on aspirin 81 mg and since running out of the Plavix, he is returned to one full adult aspirin 325. He has had no evidence of congestive heart failure or angina.  On exam, his blood pressure 141/86, pulse 76, respiratory rate is 20, and O2 sats 98%.  His sternum is stable and well healed.  The area to the upper portion of his sternum is without any evidence of infection or erythema at this point.  His lower extremities are without any swelling or and the areas of excoriation and itching have resolved.  His current medications include; 1. Lisinopril 10 mg a day. 2. Lopressor 12.5 b.i.d. 3. Aspirin 325 a day. 4. Metformin 1000 mg p.o. b.i.d. 5. Pravastatin 40 mg a day. 6. Insulin, he does not know the doses or types.  I have asked him to contact Dr. Verl Dicker Office about the discontinuation of the Plavix and he continues to see Dr. Clarene Duke for diabetic control.  Sheliah Plane, MD Electronically Signed  EG/MEDQ  D:  01/20/2011  T:  01/20/2011  Job:  [id number]  cc:   Cristy Hilts. Jacinto Halim, MD Aida Puffer

## 2011-04-22 ENCOUNTER — Encounter: Payer: Self-pay | Admitting: Internal Medicine

## 2011-05-09 ENCOUNTER — Ambulatory Visit: Payer: Medicare Other | Admitting: *Deleted

## 2011-05-11 ENCOUNTER — Encounter: Payer: Medicare Other | Attending: Family Medicine | Admitting: *Deleted

## 2011-05-11 ENCOUNTER — Encounter: Payer: Self-pay | Admitting: *Deleted

## 2011-05-11 DIAGNOSIS — E119 Type 2 diabetes mellitus without complications: Secondary | ICD-10-CM | POA: Insufficient documentation

## 2011-05-11 DIAGNOSIS — Z713 Dietary counseling and surveillance: Secondary | ICD-10-CM | POA: Insufficient documentation

## 2011-05-11 NOTE — Progress Notes (Signed)
  Medical Nutrition Therapy:  Appt start time: 10:00a  end time: 11:00p.   Assessment:  Primary concerns today: T2DM, uncontrolled.  Pt 6 mo s/p CABG with an A1c of 8.6% (previously 5.3% per wife).  Food recall shows meal skipping and excessive CHO, fat, and Na at many meals consumed. Reports he was put on insulin after CABG and caused large fluctuations in BG levels. Is now off insulin and BG returning to normal.  Reports FBGs of 70-130.  No exercise noted d/t lower back pain. Pt states he is not interested in "all this stuff you're talking about because I don't understand any of it - just tell me what to eat and how much".      MEDICATIONS: Glipizide, metformin, byetta, pravastatin (discussed grapefruit interactions), plavix, metoprolol, amlodipine, ASA   DIETARY INTAKE:  Usual eating pattern includes 1-2 meals and 1 snack per day.  24-hr recall:  B ( AM): nabs or sausage biscuit (homemade); water/unsweet tea or SKIPS Snk ( AM): none  L ( PM): Salad or bologna & onion sandwich; water/unsweet tea or SKIPS  Snk ( PM): none D ( PM): 4 oz cube steak, lima beans (1/2 c), marinated carrots;  water/unsweet tea Snk ( PM): cashews, yoplait yogurt  Usual physical activity: none  Estimated needs: 1400-1500 calories 165-170 g carbohydrates 110-115 g protein 40-42 g fat 25 g fiber < 15g saturated fat < 2000 mg sodium <200 mg cholesterol   Progress Towards Goal(s):  NEW.   Nutritional Diagnosis:  [place]-2.1 Inpaired nutrient utilization related to glucose as evidenced by referral for uncontrolled DM with A1c of 8.6%.    Intervention/Goals:  1400-1500 calories per day with 45 grams of carbs at meals and 15 grams at snacks - Make sure to add protein to all meals/snacks as discussed.   Aim for <2000 mg of sodium, <200 mg of dietary cholesterol, and <15 grams of saturated fat daily.  Choose more whole grains and beans to help meet goal of 25+ grams of fiber daily.  Avoid concentrated sweets,  sugar-sweetened beverages, and fried foods.  Increase exercise to 20 minutes daily as able.   Follow up with me as needed or if next A1c is not improved.  Monitoring/Evaluation:  Dietary intake, exercise, A1c, and body weight prn or if next A1c is not improved.

## 2011-05-11 NOTE — Patient Instructions (Addendum)
Goals:  1400-1500 calories per day with 45 grams of carbs at meals and 15 grams at snacks - Make sure to add protein to all meals/snacks as discussed.   Aim for <2000 mg of sodium, <200 mg of dietary cholesterol, and <15 grams of saturated fat daily.  Choose more whole grains and beans to help meet goal of 25+ grams of fiber daily.  Avoid concentrated sweets, sugar-sweetened beverages, and fried foods.  Increase exercise to 20 minutes daily as able.   Follow up with me as needed or if next A1c is not improved.

## 2011-06-07 ENCOUNTER — Encounter: Payer: Self-pay | Admitting: *Deleted

## 2011-06-16 ENCOUNTER — Encounter: Payer: Self-pay | Admitting: Internal Medicine

## 2011-09-12 ENCOUNTER — Other Ambulatory Visit: Payer: Self-pay | Admitting: Orthopedic Surgery

## 2011-09-12 DIAGNOSIS — M48061 Spinal stenosis, lumbar region without neurogenic claudication: Secondary | ICD-10-CM

## 2011-09-19 ENCOUNTER — Ambulatory Visit
Admission: RE | Admit: 2011-09-19 | Discharge: 2011-09-19 | Disposition: A | Discharge status: Home / Self Care | Payer: Medicare Other | Source: Ambulatory Visit | Attending: Orthopedic Surgery | Admitting: Orthopedic Surgery

## 2011-09-19 DIAGNOSIS — M48061 Spinal stenosis, lumbar region without neurogenic claudication: Secondary | ICD-10-CM

## 2011-09-19 MED ORDER — IOHEXOL 180 MG/ML  SOLN
15.0000 mL | Freq: Once | INTRAMUSCULAR | Status: AC | PRN
  Administered 2011-09-19: 15 mL via INTRATHECAL

## 2011-09-19 MED ORDER — IOHEXOL 180 MG/ML  SOLN: 15.0000 mL | Freq: Once | INTRAMUSCULAR | Status: AC | PRN

## 2011-09-19 MED ORDER — DIAZEPAM 5 MG PO TABS
5.0000 mg | ORAL_TABLET | Freq: Once | ORAL | Status: AC
  Administered 2011-09-19: 5 mg via ORAL

## 2011-09-19 NOTE — Progress Notes (Signed)
Patient states he has been off Plavix for the past five days. 

## 2011-09-19 NOTE — Patient Instructions (Signed)

## 2011-10-06 ENCOUNTER — Encounter: Payer: Self-pay | Admitting: Internal Medicine

## 2011-12-06 ENCOUNTER — Telehealth: Payer: Self-pay | Admitting: Internal Medicine

## 2011-12-06 NOTE — Telephone Encounter (Signed)
10-06-11 sent past due letter/mt 12-06-11 lmm @231p  for pt to call to set up pacer ck, either allred or device/mt

## 2012-07-06 ENCOUNTER — Telehealth: Payer: Self-pay | Admitting: Internal Medicine

## 2012-07-06 NOTE — Telephone Encounter (Signed)
07-06-12 lmm @ 412pm for pt to call to set up past due pacer check with allred or let us know if being check elsewhere/mt

## 2012-08-02 IMAGING — CR DG CHEST 1V PORT
1 series · 1 of 1 positions shown · non-contrast
Comparison: Portable chest x-ray of 10/14/2010

CLINICAL DATA: CABG, shortness of breath

PORTABLE CHEST - 1 VIEW

[AP]
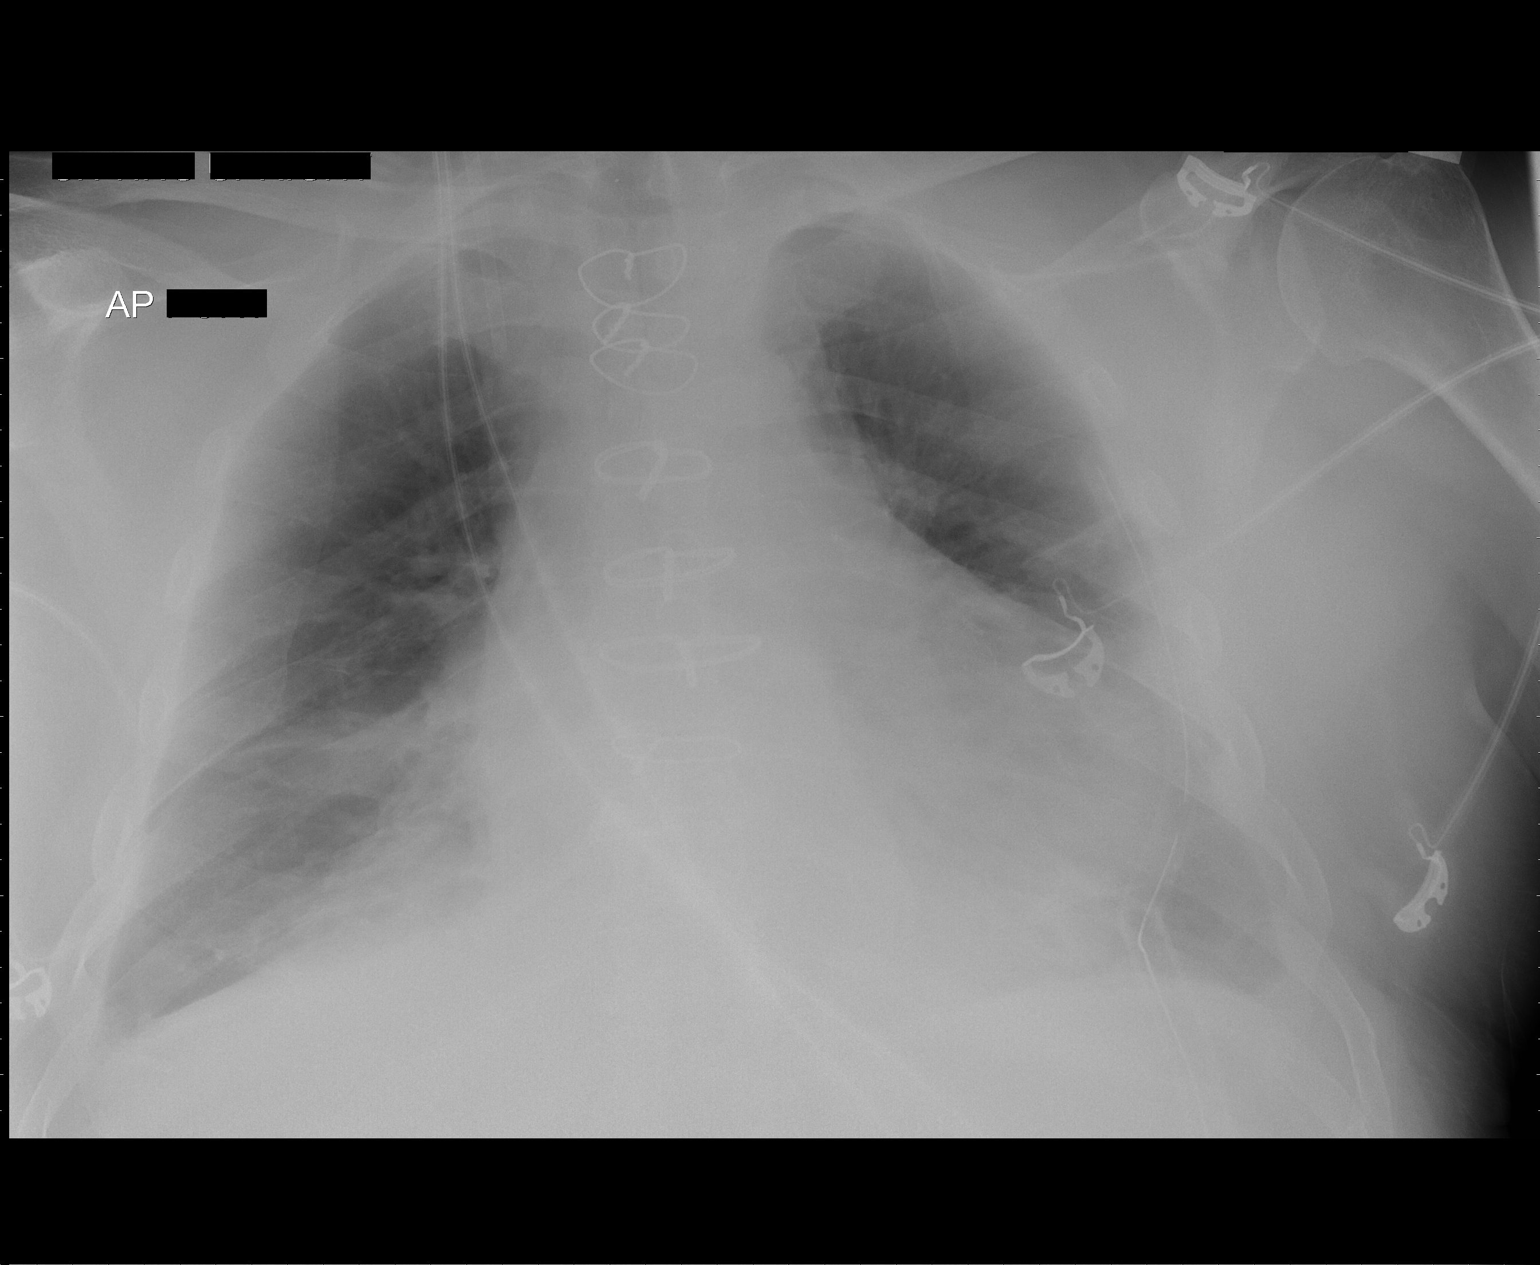

[1 of 1 positions shown; findings below may reference images not displayed]

FINDINGS: The Swan-Ganz catheter has been removed.  No pneumothorax
is seen.  Cardiomegaly, basilar atelectasis and small effusions
remain.  There may be minimal pulmonary vascular congestion
present.  Median sternotomy sutures again noted.
IMPRESSION: Swan-Ganz catheter removed.  No pneumothorax.  Little change in
basilar atelectasis, effusions, possible mild congestion.

## 2012-08-03 IMAGING — CR DG CHEST 2V
2 series · 2 of 2 positions shown · non-contrast
Comparison: 10/15/2010.

CLINICAL DATA: Short of breath.  CABG.

CHEST - 2 VIEW

[w chest pa]
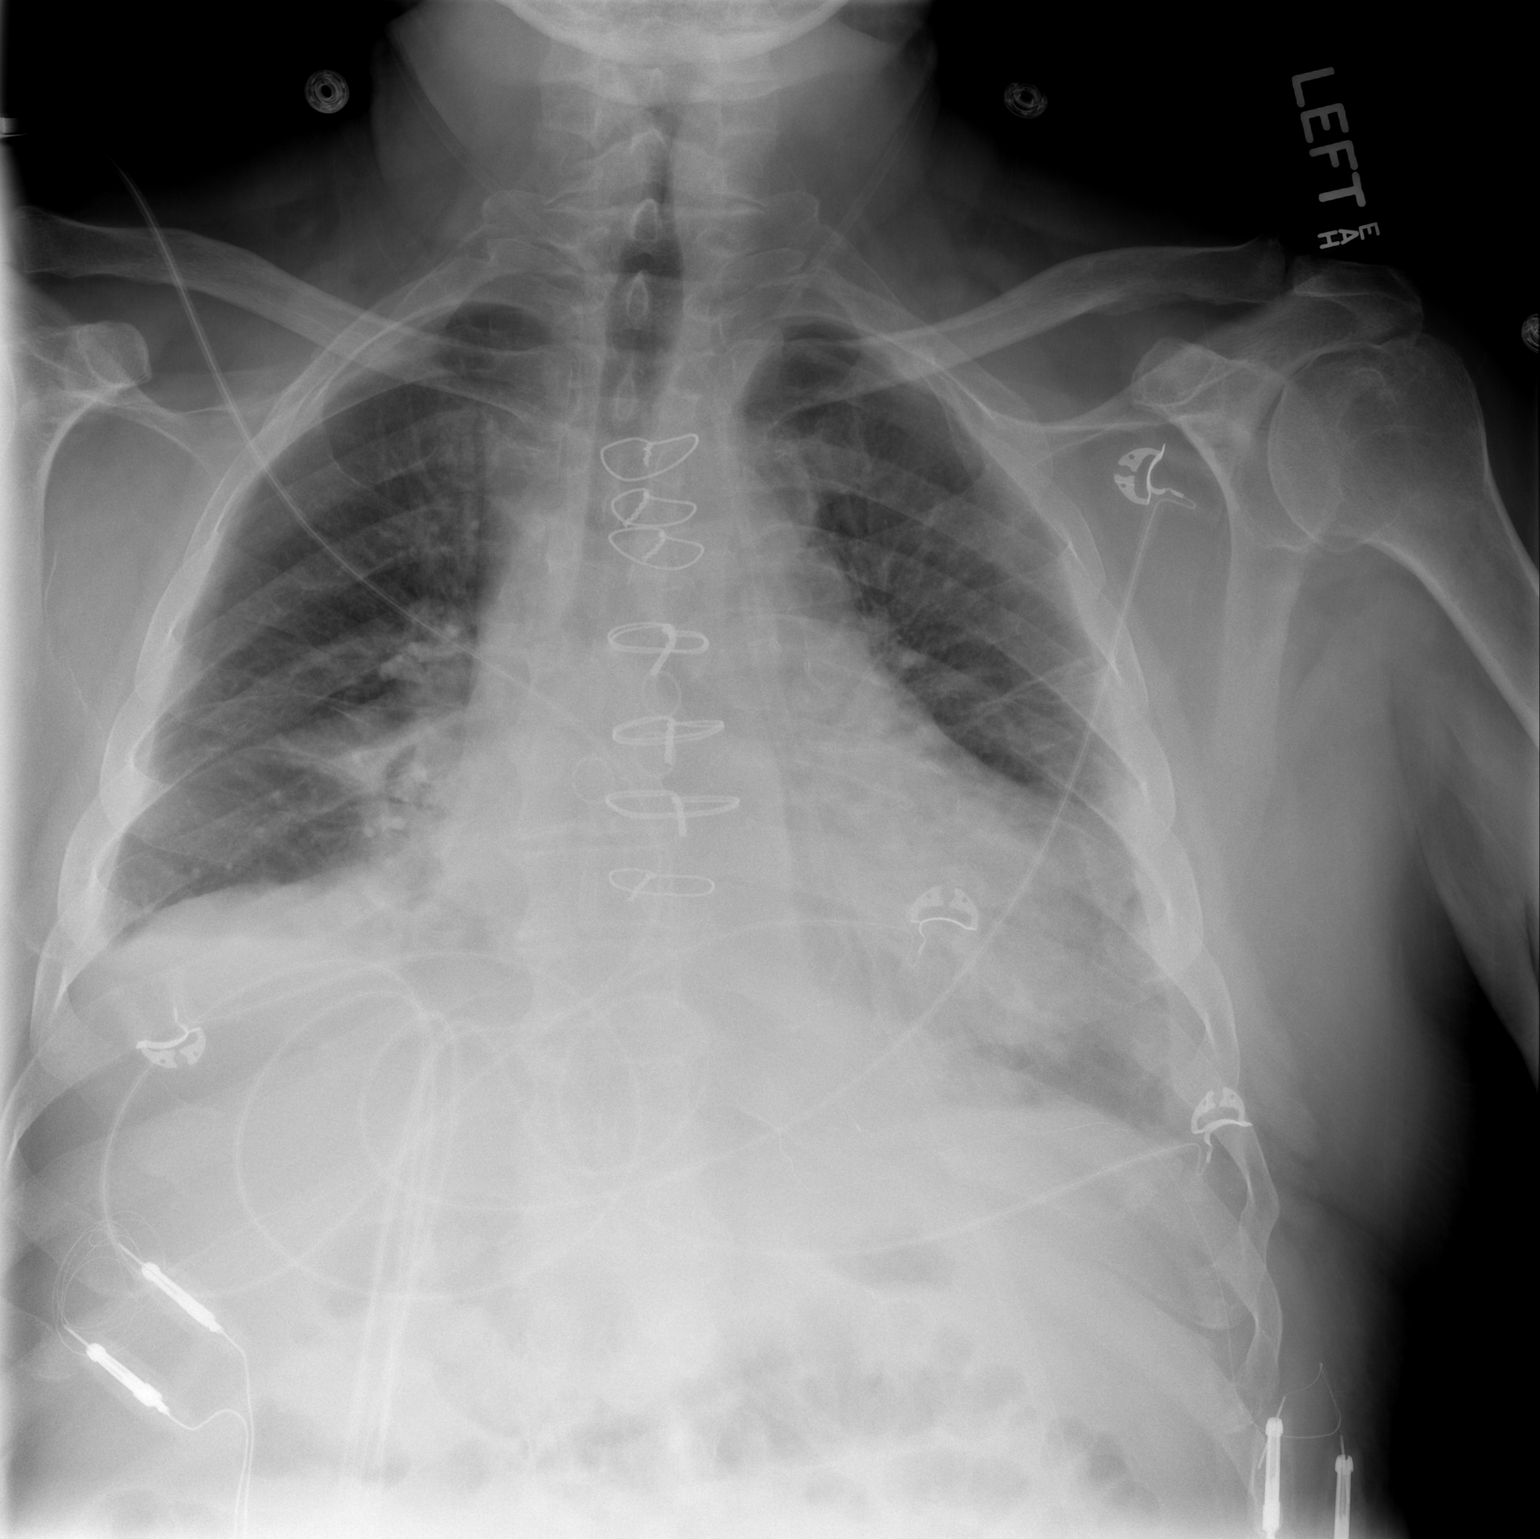

[w chest lat]
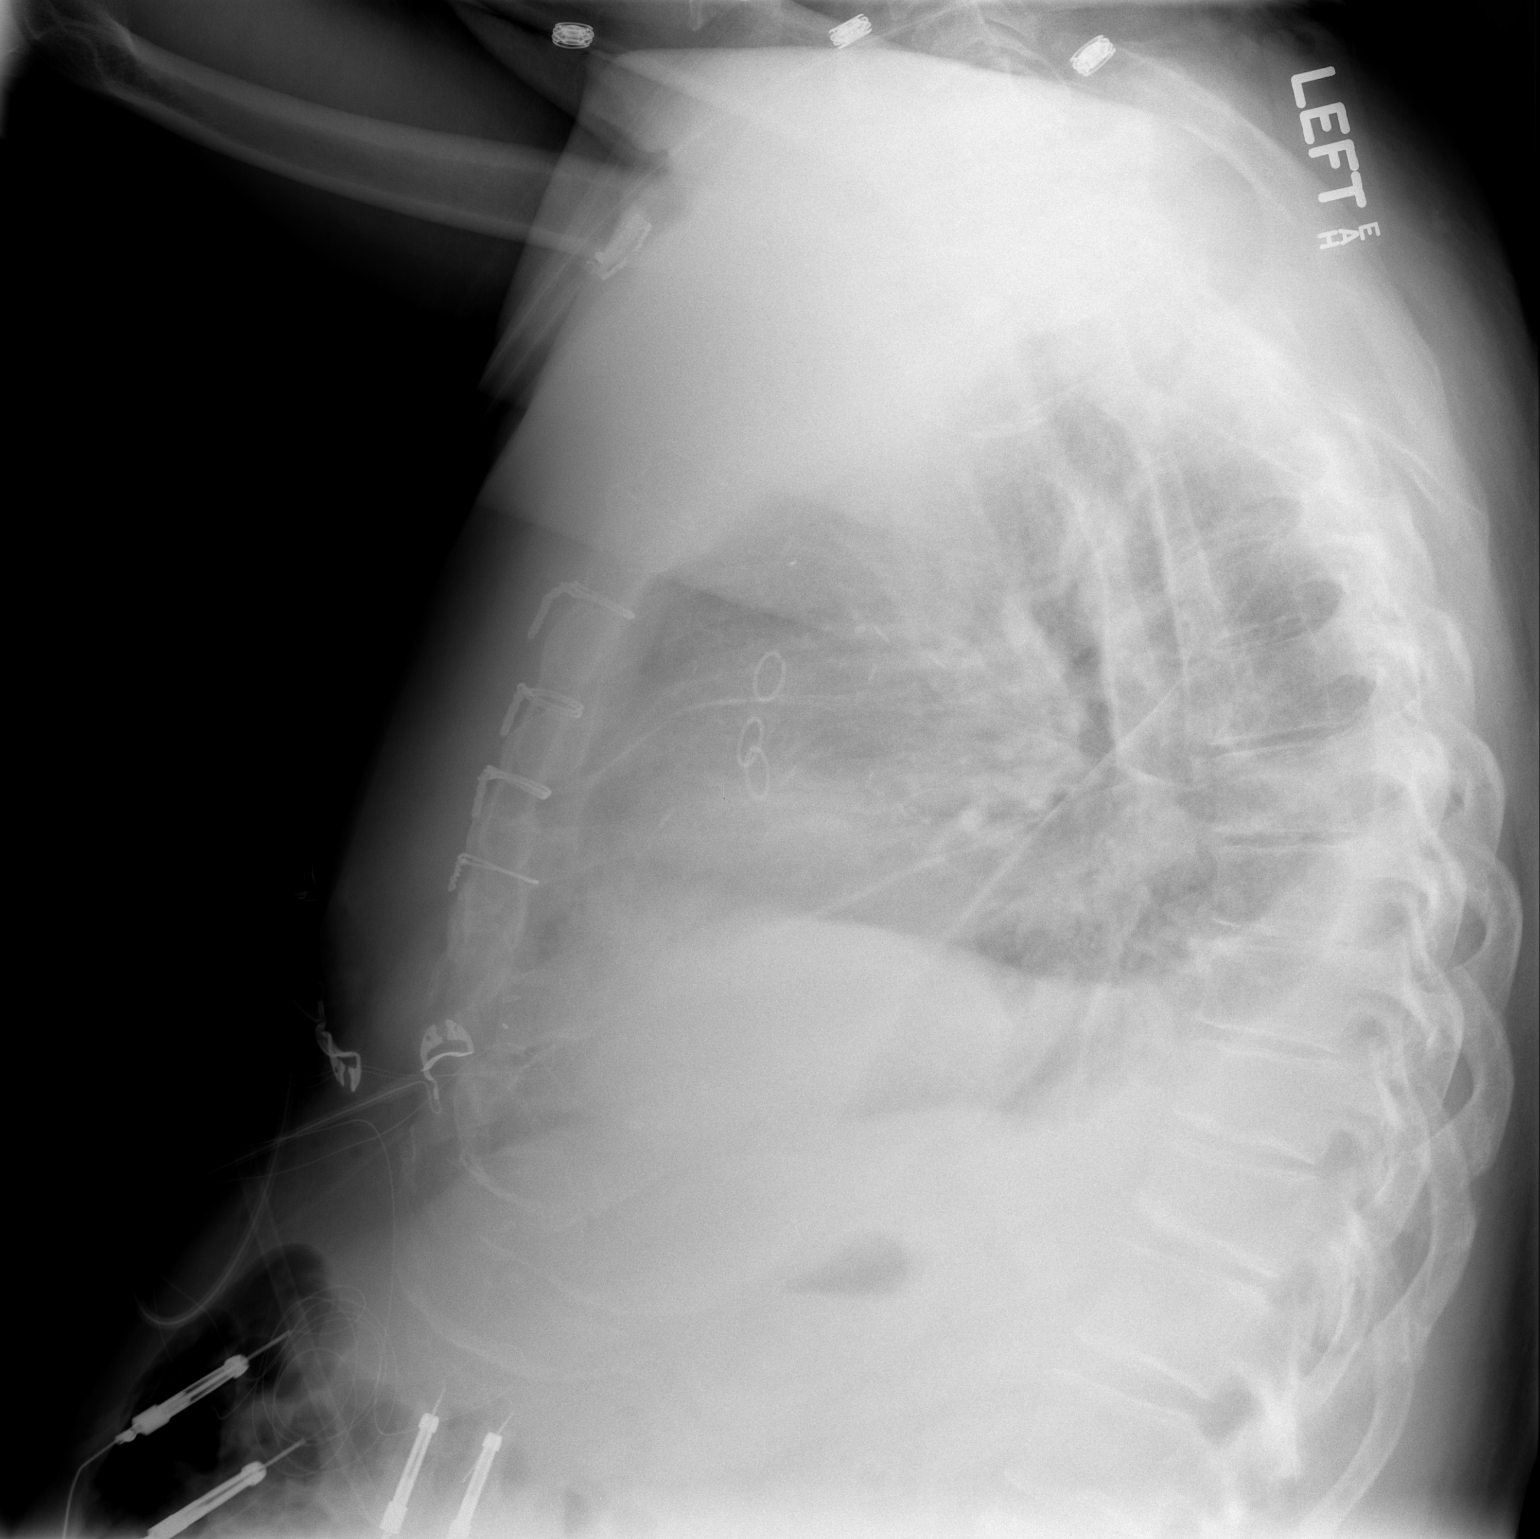

[2 of 2 positions shown; findings below may reference images not displayed]

FINDINGS: Cardiomegaly.  Interval removal of the right IJ vascular
sheath.  Median sternotomy and CABG markers.  Subsegmental
atelectasis and / or scarring along the right heart border.  Lung
volumes are low.  Bilateral pleural effusions are small to
moderate.  Right effusion appears subpulmonic.
IMPRESSION: 1.  Interval removal of right IJ vascular sheath.
2.  Cardiomegaly and CABG.
3.  Little change and pulmonary aeration with bilateral
atelectasis.

## 2012-08-07 IMAGING — CR DG CHEST 2V
2 series · 2 of 2 positions shown · non-contrast
Comparison: Chest 10/16/2010.

CLINICAL DATA: Status post pacemaker placement.

CHEST - 2 VIEW

[w chest pa]
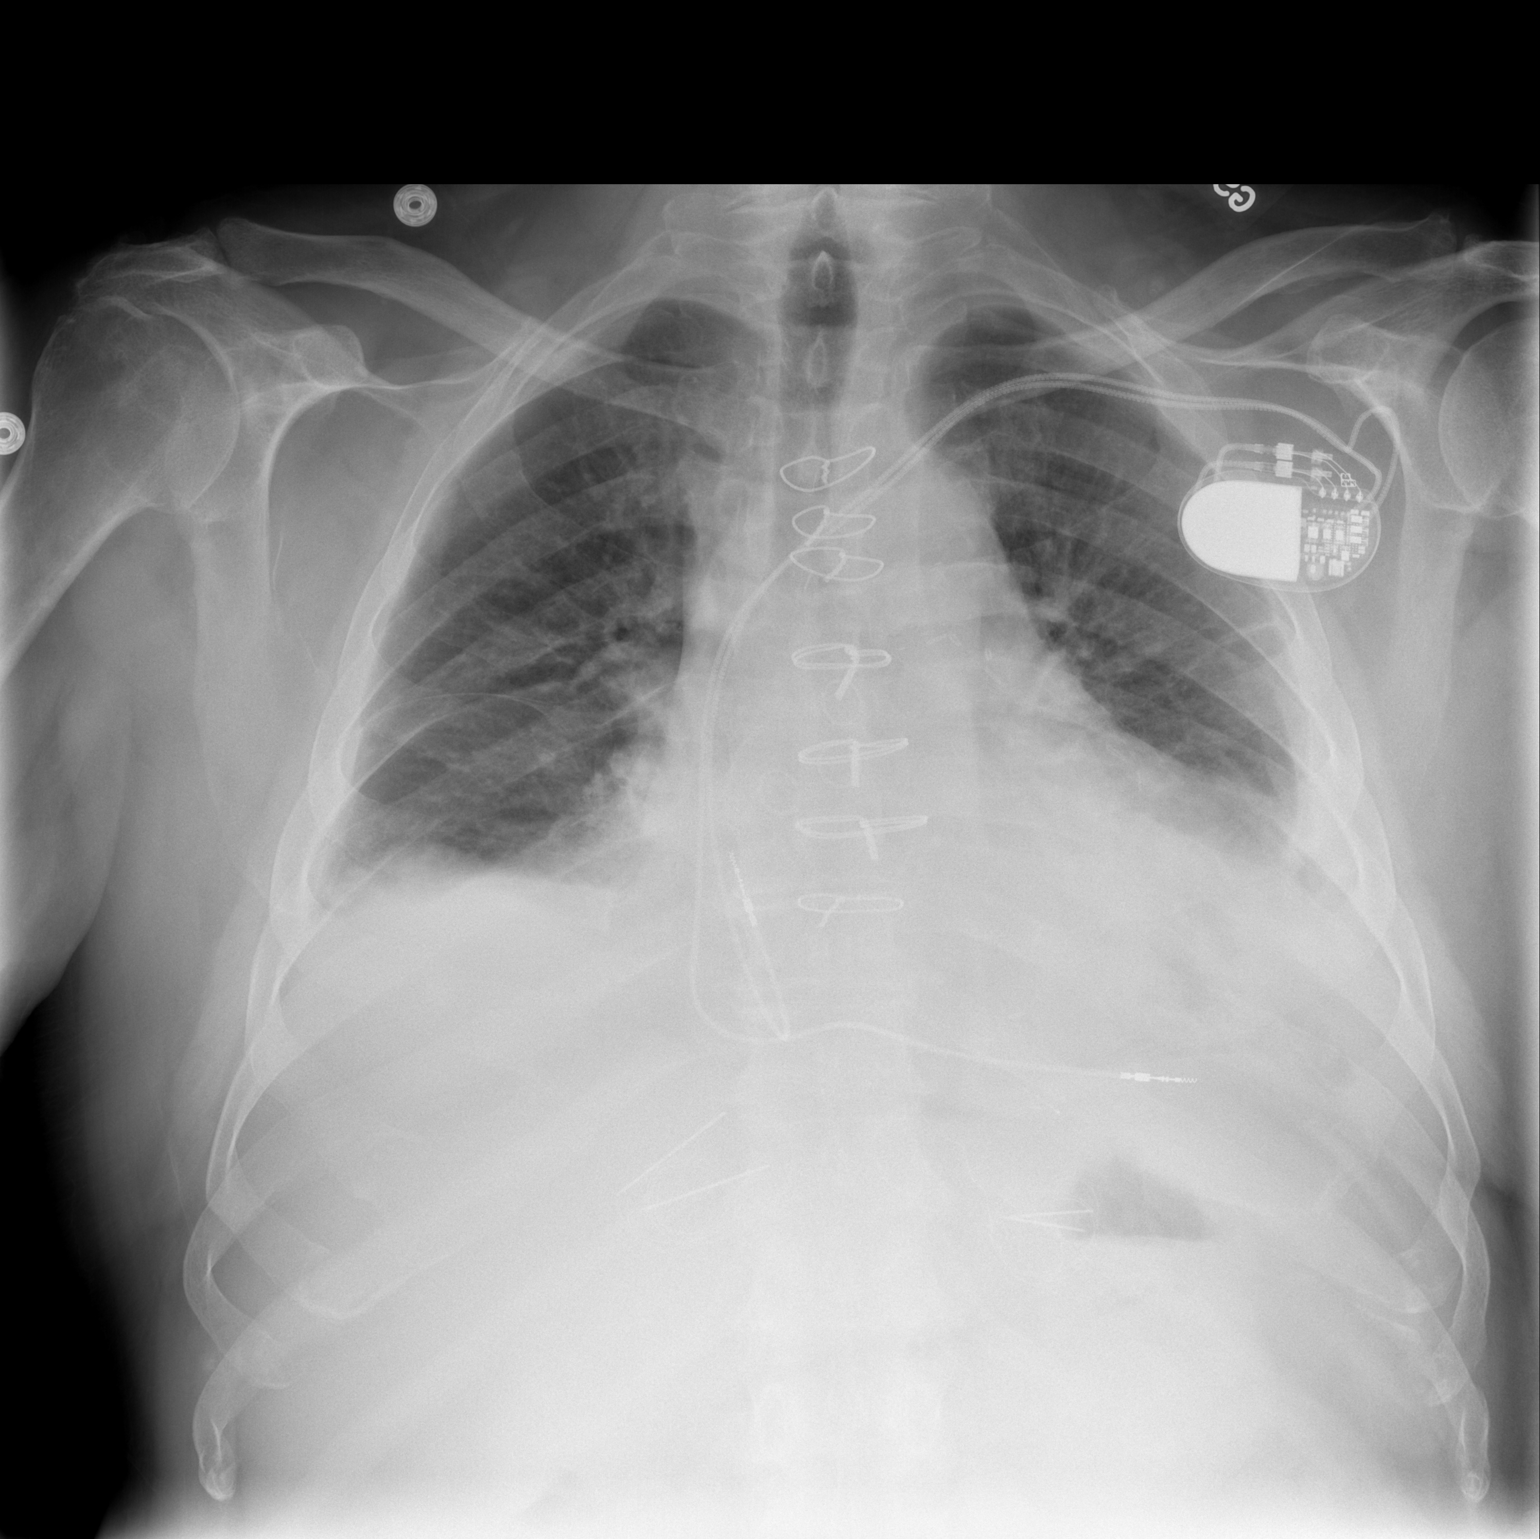

[w chest lat *]
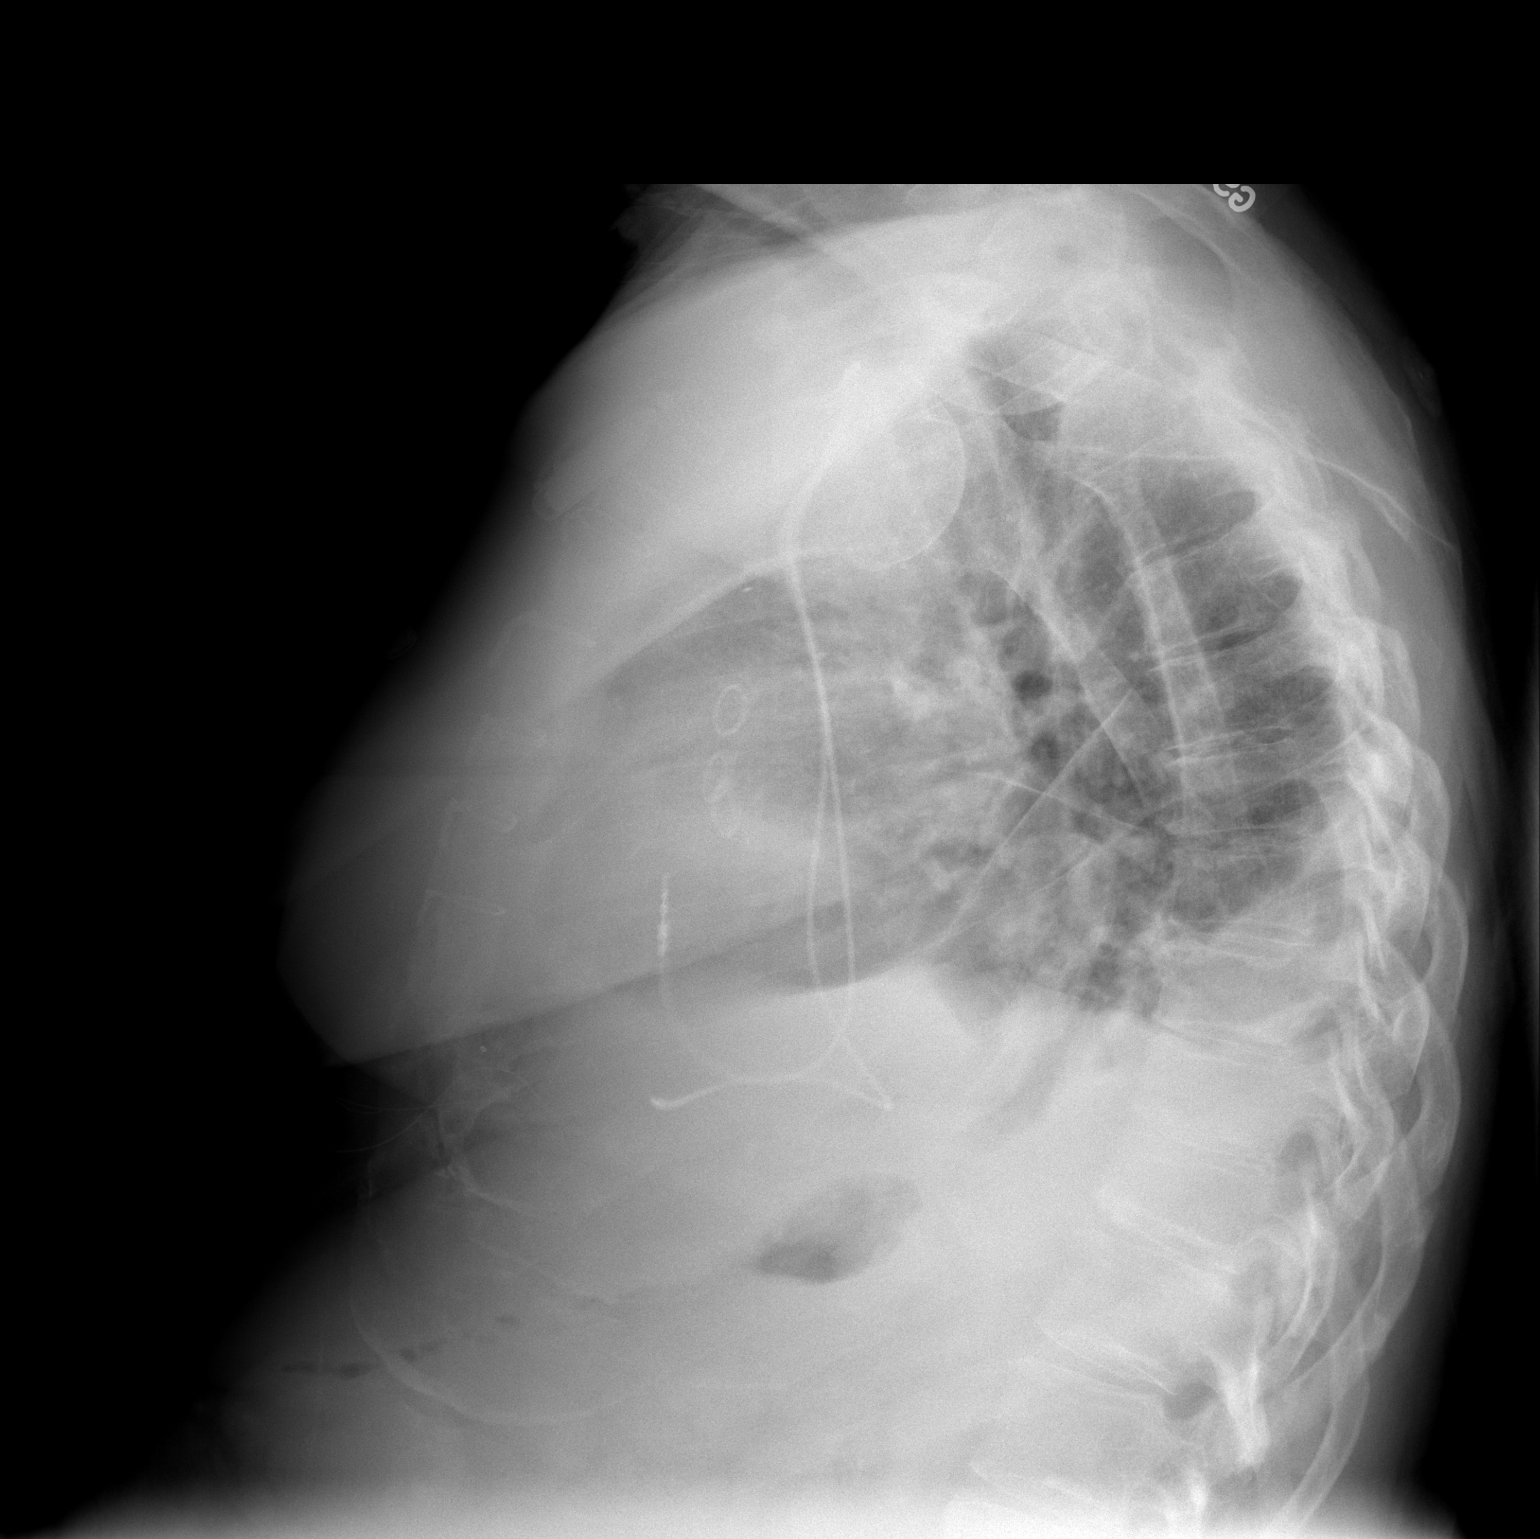

[2 of 2 positions shown; findings below may reference images not displayed]

FINDINGS: The patient has a new dual lead pacing device in place.
No pneumothorax.  The tip of the proximal lead is in the right
atrium and distal lead in the apex of the right ventricle.  Small
bilateral pleural effusions and basilar atelectasis, left greater
than right, persist with some worsening on the left.  Cardiomegaly
and pulmonary vascular congestion.
IMPRESSION: 1.  Pacing device in place without evidence of complication.
2.  Small bilateral pleural effusions and basilar atelectasis,
worse on the left.  Left effusion is increased.
3.  Cardiomegaly and vascular congestion.

## 2013-04-09 ENCOUNTER — Telehealth: Payer: Self-pay | Admitting: Internal Medicine

## 2013-04-09 ENCOUNTER — Encounter: Payer: Self-pay | Admitting: Internal Medicine

## 2013-04-09 NOTE — Telephone Encounter (Signed)
04-09-13 sent past due certified letter/mt °

## 2013-05-02 NOTE — Telephone Encounter (Signed)
LMOM stating we erroneously contacted him--- updated Specialty Comments in Epic. Estella Husk

## 2013-05-02 NOTE — Telephone Encounter (Signed)
New Prob     Pt has some concerns regarding a certified letter he received in regards to a remote pacer check. Please call.

## 2013-05-09 ENCOUNTER — Telehealth: Payer: Self-pay | Admitting: Internal Medicine

## 2013-05-09 NOTE — Telephone Encounter (Signed)
Per pt followed by Dr Thea Alken

## 2013-05-09 NOTE — Telephone Encounter (Signed)
Certified letter sent 04-09-13, signed by pt 04-24-13 and rtn to office 05-08-13/mt

## 2014-06-12 ENCOUNTER — Encounter (HOSPITAL_COMMUNITY): Payer: Self-pay | Admitting: Pharmacy Technician

## 2014-06-17 ENCOUNTER — Encounter (HOSPITAL_COMMUNITY)
Admission: RE | Disposition: A | Discharge status: Home / Self Care | Payer: Self-pay | Source: Ambulatory Visit | Attending: Cardiology

## 2014-06-17 ENCOUNTER — Ambulatory Visit (HOSPITAL_COMMUNITY)
Admission: RE | Admit: 2014-06-17 | Discharge: 2014-06-17 | Disposition: A | Discharge status: Home / Self Care | Payer: Medicare Other | Source: Ambulatory Visit | Attending: Cardiology | Admitting: Cardiology

## 2014-06-17 DIAGNOSIS — I4891 Unspecified atrial fibrillation: Secondary | ICD-10-CM | POA: Diagnosis not present

## 2014-06-17 DIAGNOSIS — E119 Type 2 diabetes mellitus without complications: Secondary | ICD-10-CM | POA: Diagnosis not present

## 2014-06-17 DIAGNOSIS — R0609 Other forms of dyspnea: Secondary | ICD-10-CM | POA: Diagnosis present

## 2014-06-17 DIAGNOSIS — I10 Essential (primary) hypertension: Secondary | ICD-10-CM | POA: Diagnosis not present

## 2014-06-17 DIAGNOSIS — Z950 Presence of cardiac pacemaker: Secondary | ICD-10-CM | POA: Insufficient documentation

## 2014-06-17 DIAGNOSIS — I495 Sick sinus syndrome: Secondary | ICD-10-CM | POA: Diagnosis not present

## 2014-06-17 DIAGNOSIS — E782 Mixed hyperlipidemia: Secondary | ICD-10-CM | POA: Insufficient documentation

## 2014-06-17 DIAGNOSIS — I2510 Atherosclerotic heart disease of native coronary artery without angina pectoris: Secondary | ICD-10-CM | POA: Diagnosis not present

## 2014-06-17 DIAGNOSIS — I25810 Atherosclerosis of coronary artery bypass graft(s) without angina pectoris: Secondary | ICD-10-CM | POA: Diagnosis not present

## 2014-06-17 HISTORY — PX: LEFT HEART CATHETERIZATION WITH CORONARY ANGIOGRAM: SHX5451

## 2014-06-17 LAB — GLUCOSE, CAPILLARY: Glucose-Capillary: 139 mg/dL — ABNORMAL HIGH (ref 70–99)

## 2014-06-17 SURGERY — LEFT HEART CATHETERIZATION WITH CORONARY ANGIOGRAM
Anesthesia: LOCAL

## 2014-06-17 MED ORDER — ACETAMINOPHEN 325 MG PO TABS: 650.0000 mg | ORAL_TABLET | ORAL | Status: DC | PRN

## 2014-06-17 MED ORDER — SODIUM CHLORIDE 0.9 % IJ SOLN: 3.0000 mL | Freq: Two times a day (BID) | INTRAMUSCULAR | Status: DC

## 2014-06-17 MED ORDER — SODIUM CHLORIDE 0.9 % IV SOLN
250.0000 mL | INTRAVENOUS | Status: DC | PRN
  Administered 2014-06-17: 500 mL via INTRAVENOUS

## 2014-06-17 MED ORDER — HYDROMORPHONE HCL PF 1 MG/ML IJ SOLN
INTRAMUSCULAR | Status: AC
  Filled 2014-06-17: qty 1

## 2014-06-17 MED ORDER — ONDANSETRON HCL 4 MG/2ML IJ SOLN: 4.0000 mg | Freq: Four times a day (QID) | INTRAMUSCULAR | Status: DC | PRN

## 2014-06-17 MED ORDER — VERAPAMIL HCL 2.5 MG/ML IV SOLN
INTRAVENOUS | Status: AC
  Filled 2014-06-17: qty 2

## 2014-06-17 MED ORDER — HEPARIN (PORCINE) IN NACL 2-0.9 UNIT/ML-% IJ SOLN
INTRAMUSCULAR | Status: AC
Start: 2014-06-17 — End: 2014-06-17
  Filled 2014-06-17: qty 1000

## 2014-06-17 MED ORDER — SODIUM CHLORIDE 0.9 % IV SOLN
INTRAVENOUS | Status: DC
  Administered 2014-06-17: 09:00:00 via INTRAVENOUS

## 2014-06-17 MED ORDER — METFORMIN HCL 1000 MG PO TABS: 1000.0000 mg | ORAL_TABLET | Freq: Two times a day (BID) | ORAL | Status: AC

## 2014-06-17 MED ORDER — MIDAZOLAM HCL 2 MG/2ML IJ SOLN
INTRAMUSCULAR | Status: AC
  Filled 2014-06-17: qty 2

## 2014-06-17 MED ORDER — ASPIRIN 81 MG PO CHEW
81.0000 mg | CHEWABLE_TABLET | ORAL | Status: AC
  Administered 2014-06-17: 81 mg via ORAL

## 2014-06-17 MED ORDER — SODIUM CHLORIDE 0.9 % IJ SOLN: 3.0000 mL | INTRAMUSCULAR | Status: DC | PRN

## 2014-06-17 MED ORDER — ASPIRIN 81 MG PO CHEW
CHEWABLE_TABLET | ORAL | Status: AC
  Filled 2014-06-17: qty 1

## 2014-06-17 MED ORDER — HEPARIN SODIUM (PORCINE) 1000 UNIT/ML IJ SOLN
INTRAMUSCULAR | Status: AC
  Filled 2014-06-17: qty 1

## 2014-06-17 MED ORDER — LIDOCAINE HCL (PF) 1 % IJ SOLN
INTRAMUSCULAR | Status: AC
  Filled 2014-06-17: qty 30

## 2014-06-17 MED ORDER — SODIUM CHLORIDE 0.9 % IV SOLN: 1.0000 mL/kg/h | INTRAVENOUS | Status: DC

## 2014-06-17 NOTE — CV Procedure (Signed)
Procedure performed:  Left heart catheterization including hemodynamic monitoring of the left ventricle, LV gram, selective right and left coronary arteriography. Saphenous vein graft and left internal mammary arteriogram  Indication patient is a 73 year-old Caucasian male with history of hypertension,  hyperlipidemia,  Diabetes Mellitus   history of CABG with LIMA to LAD, SVG to PDA, SVG to OM 1 and SVG to ramus intermediate. who presents with new-onset atrial fibrillation and mild dyspnea.. Patient has  had non invasive testing which was abnormal revealing inferior and lateral ischemia, intermediate risk. Hence is brought to the cardiac catheterization lab to evaluate the  coronary anatomy.  Hemodynamic data:  Left ventricular pressure was 92/8 with LVEDP of 16 mm mercury. Aortic pressure was 91/64 with a mean of 77 mm mercury. There was no pressure gradient across the aortic valve  Left ventricle: Performed in the RAO projection revealed LVEF of 50-55% with mild anterolateral hypokinesis  There was no significant MR.   Right coronary artery: Dominant. The mid RCA bifurcates into PDA and large PL A. The PD branch has high-grade 90% stenosis at the ostium, followed by total occlusion in the distal and after giving origin to several small branches. PL branch has distal 70-80% stenosis, mild diffuse disease.  SVG to RCA: Occluded.  Left main coronary artery is large with a high-grade 90% stenosis in the midsegment extending into the LAD and circumflex coronary artery.  Circumflex coronary artery: A large vessel it occluded in the midsegment, there is small branch is evident from the proximal and.  SVG to OM1: Occluded.  LAD:  It is occluded in the proximal segment. Distal and is supplied by LIMA. The LAD distal to the LIMA insertion is calcified and diffusely diseased constituting diffuse 40-50% stenosis. There is a severely diseased D1 and a filled antegradely. There is a retrograde filling of  a large D2 which is again severely diseased.  LIMA to LAD: Widely patent.  Ramus intermediate: Severely diseased from the proximal to mid segment. High-grade lesion is evident in the ostium. A small caliber vessel.  Impression: Preserved left ventricle systolic function, ejection fraction 50-50% with mild anterolateral hypokinesis.  Patient has severe native coronary artery disease, high-grade left main, occluded native LAD and circumflex coronary artery, occluded vein graft to OM1 and ramus intermediate,  and PDA. Ramus ntermediate is severely diseased, PDA at its origin high-grade stenosis, however occluded in the distal end, diffusely diseased. Hence there is no lesion amenable for percutaneous coronary intervention. LIMA to LAD is widely patent. Needs continued aggressive risk factor modification. Patient will be discharged home today.  Technique: Under sterile precautions using a 6 French right radial  arterial access, a 6 French sheath was introduced into the right radial artery. A 5 [place] Tig 4 catheter was advanced into the ascending aorta selective  left coronary artery was cannulated and angiography was performed. The catheter was pulled back Out of the body over exchange length J-wire. I attempted to engage the saphenous graft to the RCA with the same catheter, unable to. I used a JR 4 diagnostic catheter, AL-1 catheter and eventually AR-1 to engage the saphenous vein graft. Native RCA was cannulated by JR 4, LIMA to LAD also cannulated by JR 4. EXCHANGES were performed over a J-wire. Tig 4 Catheter was used to perform LV gram which was performed in RAO projection.  Catheter exchanged out of the body over J-Wire. NO immediate complications noted. Patient tolerated the procedure well.   Disposition: Will be discharged  home today with outpatient follow up.

## 2014-06-17 NOTE — H&P (Signed)
  Please see office visit notes for complete details of HPI.  

## 2014-06-17 NOTE — Interval H&P Note (Signed)
History and Physical Interval Note:  06/17/2014 10:15 AM  Roger Young  has presented today for surgery, with the diagnosis of shortness of breath/abnormal stress test  The various methods of treatment have been discussed with the patient and family. After consideration of risks, benefits and other options for treatment, the patient has consented to  Procedure(s): LEFT HEART CATHETERIZATION WITH CORONARY ANGIOGRAM (N/A) and possible PCI as a surgical intervention .  The patient's history has been reviewed, patient examined, no change in status, stable for surgery.  I have reviewed the patient's chart and labs.  Questions were answered to the patient's satisfaction.     Pamella Pert

## 2014-06-17 NOTE — Discharge Instructions (Signed)
Radial Site Care °Refer to this sheet in the next few weeks. These instructions provide you with information on caring for yourself after your procedure. Your caregiver may also give you more specific instructions. Your treatment has been planned according to current medical practices, but problems sometimes occur. Call your caregiver if you have any problems or questions after your procedure. °HOME CARE INSTRUCTIONS °· You may shower the day after the procedure. Remove the bandage (dressing) and gently wash the site with plain soap and water. Gently pat the site dry. °· Do not apply powder or lotion to the site. °· Do not submerge the affected site in water for 3 to 5 days. °· Inspect the site at least twice daily. °· Do not flex or bend the affected arm for 24 hours. °· No lifting over 5 pounds (2.3 kg) for 5 days after your procedure. °· Do not drive home if you are discharged the same day of the procedure. Have someone else drive you. °· You may drive 24 hours after the procedure unless otherwise instructed by your caregiver. °· Do not operate machinery or power tools for 24 hours. °· A responsible adult should be with you for the first 24 hours after you arrive home. °What to expect: °· Any bruising will usually fade within 1 to 2 weeks. °· Blood that collects in the tissue (hematoma) may be painful to the touch. It should usually decrease in size and tenderness within 1 to 2 weeks. °SEEK IMMEDIATE MEDICAL CARE IF: °· You have unusual pain at the radial site. °· You have redness, warmth, swelling, or pain at the radial site. °· You have drainage (other than a small amount of blood on the dressing). °· You have chills. °· You have a fever or persistent symptoms for more than 72 hours. °· You have a fever and your symptoms suddenly get worse. °· Your arm becomes pale, cool, tingly, or numb. °· You have heavy bleeding from the site. Hold pressure on the site. °Document Released: 11/12/2010 Document Revised:  01/02/2012 Document Reviewed: 11/12/2010 °ExitCare® Patient Information ©2015 [employer]. This information is not intended to replace advice given to you by your health care provider. Make sure you discuss any questions you have with your health care provider. ° °

## 2014-10-02 ENCOUNTER — Encounter (HOSPITAL_COMMUNITY): Payer: Self-pay | Admitting: Cardiology

## 2017-02-23 ENCOUNTER — Other Ambulatory Visit (HOSPITAL_COMMUNITY): Payer: Self-pay | Admitting: Urology

## 2017-02-23 DIAGNOSIS — N200 Calculus of kidney: Secondary | ICD-10-CM

## 2017-03-03 ENCOUNTER — Encounter (HOSPITAL_COMMUNITY): Payer: Self-pay | Admitting: *Deleted

## 2017-03-30 ENCOUNTER — Other Ambulatory Visit: Payer: Self-pay | Admitting: Urology

## 2017-03-31 NOTE — Patient Instructions (Signed)
Roger Young  03/31/2017   Your procedure is scheduled on: 04/07/2017    Report to [facility] Main  Entrance and check in at Radiology at 0730am then take [facility] elevators to 3rd Floor to Short Stay.     Call this number if you have problems the morning of surgery [phone]    Remember: ONLY 1 PERSON MAY GO WITH YOU TO SHORT STAY TO GET  READY MORNING OF YOUR SURGERY.  Do not eat food or drink liquids :After Midnight.     Take these medicines the morning of surgery with A SIP OF WATER: metoprolol ( Lopressor)  DO NOT TAKE ANY DIABETIC MEDICATIONS DAY OF YOUR SURGERY                               You may not have any metal on your body including hair pins and              piercings  Do not wear jewelry, , lotions, powders or perfumes, deodorant                  Men may shave face and neck.   Do not bring valuables to the hospital. [facility] IS NOT             RESPONSIBLE   FOR VALUABLES.  Contacts, dentures or bridgework may not be worn into surgery.  Leave suitcase in the car. After surgery it may be brought to your room.                   Please read over the following fact sheets you were given: _____________________________________________________________________             [facility] - Preparing for Surgery Before surgery, you can play an important role.  Because skin is not sterile, your skin needs to be as free of germs as possible.  You can reduce the number of germs on your skin by washing with CHG (chlorahexidine gluconate) soap before surgery.  CHG is an antiseptic cleaner which kills germs and bonds with the skin to continue killing germs even after washing. Please DO NOT use if you have an allergy to CHG or antibacterial soaps.  If your skin becomes reddened/irritated stop using the CHG and inform your nurse when you arrive at Short Stay. Do not shave (including legs and underarms) for at least 48 hours prior to the first CHG  shower.  You may shave your face/neck. Please follow these instructions carefully:  1.  Shower with CHG Soap the night before surgery and the  morning of Surgery.  2.  If you choose to wash your hair, wash your hair first as usual with your  normal  shampoo.  3.  After you shampoo, rinse your hair and body thoroughly to remove the  shampoo.                           4.  Use CHG as you would any other liquid soap.  You can apply chg directly  to the skin and wash                       Gently with a scrungie or clean washcloth.  5.  Apply the CHG Soap to your  body ONLY FROM THE NECK DOWN.   Do not use on face/ open                           Wound or open sores. Avoid contact with eyes, ears mouth and genitals (private parts).                       Wash face,  Genitals (private parts) with your normal soap.             6.  Wash thoroughly, paying special attention to the area where your surgery  will be performed.  7.  Thoroughly rinse your body with warm water from the neck down.  8.  DO NOT shower/wash with your normal soap after using and rinsing off  the CHG Soap.                9.  Pat yourself dry with a clean towel.            10.  Wear clean pajamas.            11.  Place clean sheets on your bed the night of your first shower and do not  sleep with pets. Day of Surgery : Do not apply any lotions/deodorants the morning of surgery.  Please wear clean clothes to the hospital/surgery center.  FAILURE TO FOLLOW THESE INSTRUCTIONS MAY RESULT IN THE CANCELLATION OF YOUR SURGERY PATIENT SIGNATURE_________________________________  NURSE SIGNATURE__________________________________  ________________________________________________________________________ How to Manage Your Diabetes Before and After Surgery  Why is it important to control my blood sugar before and after surgery? . Improving blood sugar levels before and after surgery helps healing and can limit problems. . A way of improving  blood sugar control is eating a healthy diet by: o  Eating less sugar and carbohydrates o  Increasing activity/exercise o  Talking with your doctor about reaching your blood sugar goals . High blood sugars (greater than 180 mg/dL) can raise your risk of infections and slow your recovery, so you will need to focus on controlling your diabetes during the weeks before surgery. . Make sure that the doctor who takes care of your diabetes knows about your planned surgery including the date and location.  How do I manage my blood sugar before surgery? . Check your blood sugar at least 4 times a day, starting 2 days before surgery, to make sure that the level is not too high or low. o Check your blood sugar the morning of your surgery when you wake up and every 2 hours until you get to the Short Stay unit. . If your blood sugar is less than 70 mg/dL, you will need to treat for low blood sugar: o Do not take insulin. o Treat a low blood sugar (less than 70 mg/dL) with  cup of clear juice (cranberry or apple), 4 glucose tablets, OR glucose gel. o Recheck blood sugar in 15 minutes after treatment (to make sure it is greater than 70 mg/dL). If your blood sugar is not greater than 70 mg/dL on recheck, call [phone] for further instructions. . Report your blood sugar to the short stay nurse when you get to Short Stay.  . If you are admitted to the hospital after surgery: o Your blood sugar will be checked by the staff and you will probably be given insulin after surgery (instead of oral diabetes medicines) to make sure you have good blood  sugar levels. o The goal for blood sugar control after surgery is 80-180 mg/dL.   WHAT DO I DO ABOUT MY DIABETES MEDICATION?  Marland Kitchen. Do not take oral diabetes medicines (pills) the morning of surgery.   .   . The day of surgery, do not take other diabetes injectables, including Byetta (exenatide), Bydureon (exenatide ER), Victoza (liraglutide), or Trulicity  (dulaglutide).  .  .       Patient Signature:  Date:   Nurse Signature:  Date:   Reviewed and Endorsed by [facility] Patient Education [facility], August 2015

## 2017-04-04 ENCOUNTER — Encounter (INDEPENDENT_AMBULATORY_CARE_PROVIDER_SITE_OTHER): Payer: Self-pay

## 2017-04-04 ENCOUNTER — Encounter (HOSPITAL_COMMUNITY): Payer: Self-pay

## 2017-04-04 ENCOUNTER — Encounter (HOSPITAL_COMMUNITY)
Admission: RE | Admit: 2017-04-04 | Discharge: 2017-04-04 | Disposition: A | Discharge status: Home / Self Care | Payer: Medicare Other | Source: Ambulatory Visit | Attending: Urology | Admitting: Urology

## 2017-04-04 DIAGNOSIS — I519 Heart disease, unspecified: Secondary | ICD-10-CM | POA: Diagnosis not present

## 2017-04-04 DIAGNOSIS — N529 Male erectile dysfunction, unspecified: Secondary | ICD-10-CM | POA: Diagnosis not present

## 2017-04-04 DIAGNOSIS — I10 Essential (primary) hypertension: Secondary | ICD-10-CM | POA: Diagnosis not present

## 2017-04-04 DIAGNOSIS — I700 Atherosclerosis of aorta: Secondary | ICD-10-CM | POA: Diagnosis not present

## 2017-04-04 DIAGNOSIS — E119 Type 2 diabetes mellitus without complications: Secondary | ICD-10-CM | POA: Diagnosis not present

## 2017-04-04 DIAGNOSIS — I110 Hypertensive heart disease with heart failure: Secondary | ICD-10-CM | POA: Diagnosis not present

## 2017-04-04 DIAGNOSIS — Z7901 Long term (current) use of anticoagulants: Secondary | ICD-10-CM | POA: Diagnosis not present

## 2017-04-04 DIAGNOSIS — Z87442 Personal history of urinary calculi: Secondary | ICD-10-CM | POA: Diagnosis not present

## 2017-04-04 DIAGNOSIS — Z9889 Other specified postprocedural states: Secondary | ICD-10-CM | POA: Diagnosis not present

## 2017-04-04 DIAGNOSIS — E7800 Pure hypercholesterolemia, unspecified: Secondary | ICD-10-CM | POA: Diagnosis not present

## 2017-04-04 DIAGNOSIS — I2510 Atherosclerotic heart disease of native coronary artery without angina pectoris: Secondary | ICD-10-CM | POA: Diagnosis not present

## 2017-04-04 DIAGNOSIS — Z79899 Other long term (current) drug therapy: Secondary | ICD-10-CM | POA: Diagnosis not present

## 2017-04-04 DIAGNOSIS — N200 Calculus of kidney: Secondary | ICD-10-CM | POA: Diagnosis not present

## 2017-04-04 DIAGNOSIS — Z8249 Family history of ischemic heart disease and other diseases of the circulatory system: Secondary | ICD-10-CM | POA: Diagnosis not present

## 2017-04-04 DIAGNOSIS — G4730 Sleep apnea, unspecified: Secondary | ICD-10-CM | POA: Diagnosis not present

## 2017-04-04 DIAGNOSIS — Z951 Presence of aortocoronary bypass graft: Secondary | ICD-10-CM | POA: Diagnosis not present

## 2017-04-04 DIAGNOSIS — Z7984 Long term (current) use of oral hypoglycemic drugs: Secondary | ICD-10-CM | POA: Diagnosis not present

## 2017-04-04 DIAGNOSIS — Z950 Presence of cardiac pacemaker: Secondary | ICD-10-CM | POA: Diagnosis not present

## 2017-04-04 DIAGNOSIS — R310 Gross hematuria: Secondary | ICD-10-CM | POA: Diagnosis present

## 2017-04-04 DIAGNOSIS — F17220 Nicotine dependence, chewing tobacco, uncomplicated: Secondary | ICD-10-CM | POA: Diagnosis not present

## 2017-04-04 DIAGNOSIS — I509 Heart failure, unspecified: Secondary | ICD-10-CM | POA: Diagnosis not present

## 2017-04-04 HISTORY — DX: Personal history of urinary calculi: Z87.442

## 2017-04-04 LAB — BASIC METABOLIC PANEL
Anion gap: 11 (ref 5–15)
BUN: 15 mg/dL (ref 6–20)
CHLORIDE: 98 mmol/L — AB (ref 101–111)
CO2: 27 mmol/L (ref 22–32)
Calcium: 9.8 mg/dL (ref 8.9–10.3)
Creatinine, Ser: 0.92 mg/dL (ref 0.61–1.24)
Glucose, Bld: 340 mg/dL — ABNORMAL HIGH (ref 65–99)
Potassium: 4.8 mmol/L (ref 3.5–5.1)
SODIUM: 136 mmol/L (ref 135–145)

## 2017-04-04 LAB — CBC
HCT: 45 % (ref 39.0–52.0)
Hemoglobin: 15.2 g/dL (ref 13.0–17.0)
MCH: 29.5 pg (ref 26.0–34.0)
MCHC: 33.8 g/dL (ref 30.0–36.0)
MCV: 87.2 fL (ref 78.0–100.0)
Platelets: 207 10*3/uL (ref 150–400)
RBC: 5.16 MIL/uL (ref 4.22–5.81)
RDW: 13 % (ref 11.5–15.5)
WBC: 7.6 10*3/uL (ref 4.0–10.5)

## 2017-04-04 LAB — GLUCOSE, CAPILLARY: Glucose-Capillary: 370 mg/dL — ABNORMAL HIGH (ref 65–99)

## 2017-04-04 NOTE — Progress Notes (Signed)
12/23/16-LOV- Dr Jacinto HalimGanji on chart  EKG-10/2/170  On chart  LOV-PCP- 11/15/16- on chart

## 2017-04-05 LAB — HEMOGLOBIN A1C
HEMOGLOBIN A1C: 9.9 % — AB (ref 4.8–5.6)
MEAN PLASMA GLUCOSE: 237 mg/dL

## 2017-04-05 NOTE — H&P (Signed)
HPI: Roger Young is a 76 year-old male with a 2 cm stone in his right kidney who presents for PCNL.  He first noticed the symptoms approximately 12/19/2016. He did not see the blood in his urine. He has not seen blood clots.   He does have a burning sensation when he urinates. He is not currently having trouble urinating.   He has not had kidney stones. He is not having pain. He has not recently had unwanted weight loss.   His last U/S or CT Scan was 02/13/2017.   02/06/17 Gross hematuria: He indicated that he has had slowing of his urinary stream for some time and also has erectile dysfunction. He takes Eliquis.  He reports he's been seen what he describes as rusty colored urine for about 2 months. He denies any pain. He's never had a UTI or prostatitis. There is no family history of GU malignancy but his brother has had kidney stones. He's never had any kidney stones. He's never been a smoker but dips tobacco. 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  His stream is slow but he has no real hesitancy and describes no nocturia or other significant voiding symptoms.   Interval history 02/21/17: He said he has been doing well with no further hematuria. He also indicates he has not had any flank pain. He has never had a kidney stone.     ALLERGIES: None   MEDICATIONS: Metformin Hcl  Metoprolol Succinate  Eliquis  Rosuvastatin Calcium     GU PSH: Locm 300-399Mg /Ml Iodine,1Ml - 02/13/2017    NON-GU PSH: Coronary Artery Bypass Grafting Pacemaker placement    GU PMH: BPH w/LUTS, He does have BPH with some voiding symptoms. - 02/06/2017 Gross hematuria, We discussed the need to evaluate him completely for the source of his gross hematuria with renal function studies which have been done recently, upper tract evaluation with a CT scan and completion of his hematuria evaluation with cystoscopy. - 02/06/2017 Urinary Hesitancy, His voiding symptoms are mild  and do not warrant medical management at this time. - 02/06/2017    NON-GU PMH: Diabetes Type 2 Heart disease, unspecified Hypercholesterolemia Hypertension Sleep Apnea    FAMILY HISTORY: 2 sons - Runs in Family Heart Attack - Father Heart Disease - Father, Mother   SOCIAL HISTORY: Marital Status: Divorced Current Smoking Status: Patient has never smoked.   Tobacco Use Assessment Completed: Used Tobacco in last 30 days? Does drink.  Drinks 1 caffeinated drink per day.    REVIEW OF SYSTEMS:    GU Review Male:   Patient denies frequent urination, hard to postpone urination, burning/ pain with urination, get up at night to urinate, leakage of urine, stream starts and stops, trouble starting your stream, have to strain to urinate , erection problems, and penile pain.  Gastrointestinal (Upper):   Patient denies nausea, vomiting, and indigestion/ heartburn.  Gastrointestinal (Lower):   Patient denies constipation and diarrhea.  Constitutional:   Patient denies fever, night sweats, weight loss, and fatigue.  Skin:   Patient denies skin rash/ lesion and itching.  Eyes:   Patient denies blurred vision and double vision.  Ears/ Nose/ Throat:   Patient denies sore throat and sinus problems.  Hematologic/Lymphatic:   Patient denies swollen glands and easy bruising.  Cardiovascular:   Patient denies leg swelling and chest pains.  Respiratory:   Patient denies cough and shortness of breath.  Endocrine:   Patient denies excessive thirst.  Musculoskeletal:  Patient denies back pain and joint pain.  Neurological:   Patient denies headaches and dizziness.  Psychologic:   Patient denies depression and anxiety.   VITAL SIGNS:    Weight 180 lb / 81.65 kg  Height 66 in / 167.64 cm  BP 131/68 mmHg  Pulse 88 /min  Pulse Oximetry 95 %  BMI 29.0 kg/m   GU PHYSICAL EXAMINATION:    Anus and Perineum: No hemorrhoids. No anal stenosis. No rectal fissure, no anal fissure. No edema, no dimple, no  perineal tenderness, no anal tenderness.  Scrotum: No lesions. No edema. No cysts. No warts.  Epididymides: Right: no spermatocele, no masses, no cysts, no tenderness, no induration, no enlargement. Left: no spermatocele, no masses, no cysts, no tenderness, no induration, no enlargement.  Testes: No tenderness, no swelling, no enlargement left testes. No tenderness, no swelling, no enlargement right testes. Normal location left testes. Normal location right testes. No mass, no cyst, no varicocele, no hydrocele left testes. No mass, no cyst, no varicocele, no hydrocele right testes.  Urethral Meatus: Normal size. No lesion, no wart, no discharge, no polyp. Normal location.  Penis: Circumcised, no warts, no cracks. No dorsal Peyronie's plaques, no left corporal Peyronie's plaques, no right corporal Peyronie's plaques, no scarring, no warts. No balanitis, no meatal stenosis.  Prostate: Prostate 2 1/2+ size. Left lobe normal consistency, right lobe normal consistency. Symmetrical lobes. No prostate nodule. Left lobe no tenderness, right lobe no tenderness.   Seminal Vesicles: Nonpalpable.  Sphincter Tone: Normal sphincter. No rectal tenderness. No rectal mass.    MULTI-SYSTEM PHYSICAL EXAMINATION:    Constitutional: Well-nourished. No physical deformities. Normally developed. Good grooming.  Neck: Neck symmetrical, not swollen. Normal tracheal position.  Respiratory: No labored breathing, no use of accessory muscles.   Cardiovascular: Normal temperature, normal extremity pulses, no swelling, no varicosities.  Lymphatic: No enlargement of neck, axillae, groin.  Skin: No paleness, no jaundice, no cyanosis. No lesion, no ulcer, no rash.  Neurologic / Psychiatric: Oriented to time, oriented to place, oriented to person. No depression, no anxiety, no agitation.  Gastrointestinal: No mass, no tenderness, no rigidity, non obese abdomen.  Eyes: Normal conjunctivae. Normal eyelids.  Ears, Nose, Mouth, and  Throat: Left ear no scars, no lesions, no masses. Right ear no scars, no lesions, no masses. Nose no scars, no lesions, no masses. Normal hearing. Normal lips.  Musculoskeletal: Normal gait and station of head and neck.        PAST DATA REVIEWED:  Source Of History:  Patient  Lab Test Review:   BUN/Creatinine  Records Review:   Previous Patient Records, POC Tool  X-Ray Review: C.T. Hematuria: Reviewed Films. Reviewed Report. Discussed With Patient. EXAM: CT ABDOMEN AND PELVIS WITHOUT AND WITH CONTRAST TECHNIQUE: Multidetector CT imaging of the abdomen and pelvis was performed following the standard protocol before and following the bolus administration of intravenous contrast. CONTRAST: 125 cc Isovue COMPARISON: CT 08/14/2015 FINDINGS: Lower chest: Lung bases are clear. Hepatobiliary: No focal hepatic lesion. No biliary duct dilatation. Gallbladder is normal. Common bile duct is normal. Pancreas: Pancreas is normal. No ductal dilatation. No pancreatic inflammation. Spleen: Normal spleen Adrenals/urinary tract: Adrenal glands and kidneys normal. Large nonobstructing calculus in the RIGHT renal hilum measuring 20 mm by 10 mm in axial dimension. Lesion has a staghorn appearance extending into two renal calices with caliectasis (image 84, series 300). No ureterolithiasis or obstructive uropathy. No enhancing renal cortical lesion. No filling defects within collecting systems ureters. There are filling  defects within the RIGHT renal calices associated staghorn calculus which likely represents debris or clot. No bladder calculi, enhancing bladder lesions, or filling defect within the bladder. Stomach/Bowel: Stomach, small bowel, appendix, and cecum are normal. The colon and rectosigmoid colon are normal. Vascular/Lymphatic: Abdominal aorta is normal caliber with atherosclerotic calcification. There is no retroperitoneal or periportal lymphadenopathy. No pelvic lymphadenopathy. Reproductive: Prostate gland is  enlarged measuring 58 mm next item and in Other: No free fluid. Musculoskeletal: No aggressive osseous lesion. IMPRESSION: 1. 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 There is caliectasis and filling defects within the calices associated with the staghorn calculus. Filling defects presumably represent debris or clot. 2. No enhancing renal cortical lesion. 3. No bladder calculi, enhancing bladder lesions, or filling defect within the bladder. 4. Prostate hypertrophy .     PROCEDURES:         Flexible Cystoscopy - done on 02/21/17.  Risks, benefits, and some of the potential complications of the procedure were discussed at length with the patient including infection, bleeding, voiding discomfort, urinary retention, fever, chills, sepsis, and others. All questions were answered. Informed consent was obtained. Sterile technique and 2% Lidocaine intraurethral analgesia were used.  Meatus:  Normal size. Normal location. Normal condition.  Urethra:  No strictures.  External Sphincter:  Normal.  Verumontanum:  Normal.  Prostate:  Borderline obstructing. Mild hyperplasia.  Bladder Neck:  Non-obstructing.  Ureteral Orifices:  Normal location. Normal size. Normal shape. Effluxed clear urine.  Bladder:  Mild trabeculation. No tumors. Normal mucosa. No stones.      The lower urinary tract was carefully examined. The procedure was well-tolerated and without complications. Instructions were given to call the office immediately for bloody urine, difficulty urinating, urinary retention, painful or frequent urination, fever or other illness. The patient stated that he understood these instructions and would comply with them.         Urinalysis w/Scope Dipstick Dipstick Cont'd Micro  Color: Yellow Bilirubin: Neg WBC/hpf: 6 - 10/hpf  Appearance: Cloudy Ketones: Trace RBC/hpf: >60/hpf  Specific Gravity: 1.025 Blood: 3+ Bacteria: Few (10-25/hpf)  pH: 6.5 Protein: 2+ Cystals: Ca Oxalate  Glucose: Neg  Urobilinogen: 0.2 Casts: NS (Not Seen)    Nitrites: Neg Trichomonas: Not Present    Leukocyte Esterase: Trace Mucous: Not Present      Epithelial Cells: 0 - 5/hpf      Yeast: NS (Not Seen)      Sperm: Not Present    ASSESSMENT/PLAN:        ICD-10 Details  1 GU:   Gross hematuria - R31.0 His gross hematuria has now been confirmed to be secondary to his right renal calculus.  2   Renal calculus - N20.0 Right, We discussed the management of urinary stones. These options include observation, ureteroscopy, shockwave lithotripsy, and PCNL. We discussed which options are relevant to these particular stones. We discussed the natural history of stones as well as the complications of untreated stones and the impact on quality of life without treatment as well as with each of the above listed treatments. We also discussed the efficacy of each treatment in its ability to clear the stone burden. With any of these management options I discussed the signs and symptoms of infection and the need for emergent treatment should these be experienced. For each option we discussed the ability of each procedure to clear the patient of their stone burden. For observation I described the risks which include but are not limited to silent renal damage, life-threatening  infection, need for emergent surgery, failure to pass stone, and pain. For ureteroscopy I described the risks which include heart attack, stroke, pulmonary embolus, death, bleeding, infection, damage to contiguous structures, positioning injury, ureteral stricture, ureteral avulsion, ureteral injury, need for ureteral stent, inability to perform ureteroscopy, need for an interval procedure, inability to clear stone burden, stent discomfort and pain. For shockwave lithotripsy I described the risks which include arrhythmia, kidney contusion, kidney hemorrhage, need for transfusion, long-term risk of diabetes or hypertension, back discomfort, flank ecchymosis, flank  abrasion, inability to break up stone, inability to pass stone fragments, Steinstrasse, infection associated with obstructing stones, need for different surgical procedure and possible need for repeat shockwave lithotripsy.     He has a 2 cm stone within the right kidney and therefore the treatment that is indicated for this size stone in this location is PCNL.   His urine had bacteria and some leukocyte esterase. I don't think he is infected but we will culture this in preparation for his upcoming surgery.   He will be off his Eliquis for 3 days prior to his surgery. This was cleared with his cardiologist.

## 2017-04-05 NOTE — Progress Notes (Signed)
HGBA1C done 04/04/17 faxed via epid to Dr Vernie Ammonsttelin,

## 2017-04-06 ENCOUNTER — Other Ambulatory Visit: Payer: Self-pay | Admitting: Radiology

## 2017-04-07 ENCOUNTER — Ambulatory Visit (HOSPITAL_COMMUNITY): Payer: Medicare Other | Admitting: Anesthesiology

## 2017-04-07 ENCOUNTER — Encounter (HOSPITAL_COMMUNITY)
Admission: RE | Disposition: A | Discharge status: Home / Self Care | Payer: Self-pay | Source: Ambulatory Visit | Attending: Urology

## 2017-04-07 ENCOUNTER — Ambulatory Visit (HOSPITAL_COMMUNITY)
Admission: RE | Admit: 2017-04-07 | Discharge: 2017-04-07 | Disposition: A | Discharge status: Home / Self Care | Payer: Medicare Other | Source: Ambulatory Visit | Attending: Urology | Admitting: Urology

## 2017-04-07 ENCOUNTER — Ambulatory Visit (HOSPITAL_COMMUNITY): Payer: Medicare Other

## 2017-04-07 ENCOUNTER — Observation Stay (HOSPITAL_COMMUNITY)
Admission: RE | Admit: 2017-04-07 | Discharge: 2017-04-08 | Disposition: A | Discharge status: Home / Self Care | Payer: Medicare Other | Source: Ambulatory Visit | Attending: Urology | Admitting: Urology

## 2017-04-07 ENCOUNTER — Observation Stay (HOSPITAL_COMMUNITY): Payer: Medicare Other

## 2017-04-07 ENCOUNTER — Encounter (HOSPITAL_COMMUNITY): Payer: Self-pay

## 2017-04-07 DIAGNOSIS — I2510 Atherosclerotic heart disease of native coronary artery without angina pectoris: Secondary | ICD-10-CM | POA: Insufficient documentation

## 2017-04-07 DIAGNOSIS — Z951 Presence of aortocoronary bypass graft: Secondary | ICD-10-CM | POA: Insufficient documentation

## 2017-04-07 DIAGNOSIS — I700 Atherosclerosis of aorta: Secondary | ICD-10-CM | POA: Insufficient documentation

## 2017-04-07 DIAGNOSIS — E7800 Pure hypercholesterolemia, unspecified: Secondary | ICD-10-CM | POA: Insufficient documentation

## 2017-04-07 DIAGNOSIS — Z87442 Personal history of urinary calculi: Secondary | ICD-10-CM | POA: Insufficient documentation

## 2017-04-07 DIAGNOSIS — N529 Male erectile dysfunction, unspecified: Secondary | ICD-10-CM | POA: Insufficient documentation

## 2017-04-07 DIAGNOSIS — G4730 Sleep apnea, unspecified: Secondary | ICD-10-CM | POA: Insufficient documentation

## 2017-04-07 DIAGNOSIS — E119 Type 2 diabetes mellitus without complications: Secondary | ICD-10-CM | POA: Insufficient documentation

## 2017-04-07 DIAGNOSIS — Z950 Presence of cardiac pacemaker: Secondary | ICD-10-CM | POA: Insufficient documentation

## 2017-04-07 DIAGNOSIS — Z7984 Long term (current) use of oral hypoglycemic drugs: Secondary | ICD-10-CM | POA: Insufficient documentation

## 2017-04-07 DIAGNOSIS — F17220 Nicotine dependence, chewing tobacco, uncomplicated: Secondary | ICD-10-CM | POA: Insufficient documentation

## 2017-04-07 DIAGNOSIS — I519 Heart disease, unspecified: Secondary | ICD-10-CM | POA: Insufficient documentation

## 2017-04-07 DIAGNOSIS — I10 Essential (primary) hypertension: Secondary | ICD-10-CM | POA: Insufficient documentation

## 2017-04-07 DIAGNOSIS — N200 Calculus of kidney: Principal | ICD-10-CM | POA: Insufficient documentation

## 2017-04-07 DIAGNOSIS — Z8249 Family history of ischemic heart disease and other diseases of the circulatory system: Secondary | ICD-10-CM | POA: Insufficient documentation

## 2017-04-07 DIAGNOSIS — Z7901 Long term (current) use of anticoagulants: Secondary | ICD-10-CM | POA: Insufficient documentation

## 2017-04-07 DIAGNOSIS — Z9889 Other specified postprocedural states: Secondary | ICD-10-CM | POA: Insufficient documentation

## 2017-04-07 DIAGNOSIS — I509 Heart failure, unspecified: Secondary | ICD-10-CM | POA: Insufficient documentation

## 2017-04-07 DIAGNOSIS — Z79899 Other long term (current) drug therapy: Secondary | ICD-10-CM | POA: Insufficient documentation

## 2017-04-07 DIAGNOSIS — I110 Hypertensive heart disease with heart failure: Secondary | ICD-10-CM | POA: Insufficient documentation

## 2017-04-07 HISTORY — PX: NEPHROLITHOTOMY: SHX5134

## 2017-04-07 HISTORY — PX: IR URETERAL STENT RIGHT NEW ACCESS W/O SEP NEPHROSTOMY CATH: IMG6076

## 2017-04-07 LAB — BASIC METABOLIC PANEL
Anion gap: 11 (ref 5–15)
BUN: 20 mg/dL (ref 6–20)
CHLORIDE: 100 mmol/L — AB (ref 101–111)
CO2: 25 mmol/L (ref 22–32)
Calcium: 9.8 mg/dL (ref 8.9–10.3)
Creatinine, Ser: 1.04 mg/dL (ref 0.61–1.24)
GFR calc Af Amer: >60 mL/min (ref >60)
GLUCOSE: 329 mg/dL — AB (ref 65–99)
POTASSIUM: 4.3 mmol/L (ref 3.5–5.1)
Sodium: 136 mmol/L (ref 135–145)

## 2017-04-07 LAB — GLUCOSE, CAPILLARY
Glucose-Capillary: 326 mg/dL — ABNORMAL HIGH (ref 65–99)
Glucose-Capillary: 307 mg/dL — ABNORMAL HIGH (ref 65–99)
Glucose-Capillary: 221 mg/dL — ABNORMAL HIGH (ref 65–99)
Glucose-Capillary: 187 mg/dL — ABNORMAL HIGH (ref 65–99)
Glucose-Capillary: 200 mg/dL — ABNORMAL HIGH (ref 65–99)
Glucose-Capillary: 282 mg/dL — ABNORMAL HIGH (ref 65–99)

## 2017-04-07 LAB — CBC
HEMATOCRIT: 46.2 % (ref 39.0–52.0)
HEMOGLOBIN: 15.5 g/dL (ref 13.0–17.0)
MCH: 29.4 pg (ref 26.0–34.0)
MCHC: 33.5 g/dL (ref 30.0–36.0)
MCV: 87.7 fL (ref 78.0–100.0)
Platelets: 214 10*3/uL (ref 150–400)
RBC: 5.27 MIL/uL (ref 4.22–5.81)
RDW: 13.2 % (ref 11.5–15.5)
WBC: 7.6 10*3/uL (ref 4.0–10.5)

## 2017-04-07 LAB — APTT: APTT: 27 s (ref 24–36)

## 2017-04-07 LAB — PROTIME-INR
INR: 1.04
Prothrombin Time: 13.6 seconds (ref 11.4–15.2)

## 2017-04-07 SURGERY — NEPHROLITHOTOMY PERCUTANEOUS
Anesthesia: General | Site: Renal | Laterality: Right

## 2017-04-07 MED ORDER — DEXAMETHASONE SODIUM PHOSPHATE 10 MG/ML IJ SOLN
INTRAMUSCULAR | Status: AC
  Filled 2017-04-07: qty 2

## 2017-04-07 MED ORDER — LACTATED RINGERS IV SOLN
INTRAVENOUS | Status: DC
  Administered 2017-04-07 (×2): via INTRAVENOUS

## 2017-04-07 MED ORDER — PROPOFOL 10 MG/ML IV BOLUS
INTRAVENOUS | Status: DC | PRN
Start: 2017-04-07 — End: 2017-04-07
  Administered 2017-04-07: 30 mg via INTRAVENOUS
  Administered 2017-04-07: 110 mg via INTRAVENOUS

## 2017-04-07 MED ORDER — INSULIN ASPART 100 UNIT/ML ~~LOC~~ SOLN
0.0000 [IU] | SUBCUTANEOUS | Status: DC
  Administered 2017-04-07: 8 [IU] via SUBCUTANEOUS
  Administered 2017-04-07: 3 [IU] via SUBCUTANEOUS
  Administered 2017-04-08: 2 [IU] via SUBCUTANEOUS
  Administered 2017-04-08: 3 [IU] via SUBCUTANEOUS
  Administered 2017-04-08: 5 [IU] via SUBCUTANEOUS

## 2017-04-07 MED ORDER — ROCURONIUM BROMIDE 100 MG/10ML IV SOLN
INTRAVENOUS | Status: DC | PRN
  Administered 2017-04-07: 50 mg via INTRAVENOUS

## 2017-04-07 MED ORDER — PHENYLEPHRINE HCL 10 MG/ML IJ SOLN
INTRAVENOUS | Status: DC | PRN
  Administered 2017-04-07: 35 ug/min via INTRAVENOUS

## 2017-04-07 MED ORDER — MIDAZOLAM HCL 2 MG/2ML IJ SOLN
INTRAMUSCULAR | Status: AC
  Filled 2017-04-07: qty 4

## 2017-04-07 MED ORDER — OXYCODONE-ACETAMINOPHEN 5-325 MG PO TABS: 1.0000 | ORAL_TABLET | ORAL | Status: DC | PRN

## 2017-04-07 MED ORDER — CIPROFLOXACIN IN D5W 400 MG/200ML IV SOLN
INTRAVENOUS | Status: AC
  Administered 2017-04-07: 400 mg
  Filled 2017-04-07: qty 200

## 2017-04-07 MED ORDER — CIPROFLOXACIN IN D5W 400 MG/200ML IV SOLN: 400.0000 mg | INTRAVENOUS | Status: DC

## 2017-04-07 MED ORDER — PHENAZOPYRIDINE HCL 200 MG PO TABS: 200.0000 mg | ORAL_TABLET | Freq: Three times a day (TID) | ORAL | 0 refills | Status: DC | PRN

## 2017-04-07 MED ORDER — IOHEXOL 300 MG/ML  SOLN
INTRAMUSCULAR | Status: DC | PRN
  Administered 2017-04-07: 10 mL

## 2017-04-07 MED ORDER — ACETAMINOPHEN 10 MG/ML IV SOLN
1000.0000 mg | Freq: Four times a day (QID) | INTRAVENOUS | Status: AC
  Administered 2017-04-07 – 2017-04-08 (×4): 1000 mg via INTRAVENOUS
  Filled 2017-04-07 (×3): qty 100

## 2017-04-07 MED ORDER — SUGAMMADEX SODIUM 200 MG/2ML IV SOLN
INTRAVENOUS | Status: DC | PRN
  Administered 2017-04-07: 200 mg via INTRAVENOUS

## 2017-04-07 MED ORDER — INSULIN ASPART 100 UNIT/ML ~~LOC~~ SOLN: 5.0000 [IU] | Freq: Once | SUBCUTANEOUS | Status: DC

## 2017-04-07 MED ORDER — SODIUM CHLORIDE 0.9 % IV SOLN: INTRAVENOUS | Status: DC

## 2017-04-07 MED ORDER — FENTANYL CITRATE (PF) 250 MCG/5ML IJ SOLN
INTRAMUSCULAR | Status: AC
  Filled 2017-04-07: qty 10

## 2017-04-07 MED ORDER — HYDROMORPHONE HCL 1 MG/ML IJ SOLN: 0.5000 mg | INTRAMUSCULAR | Status: DC | PRN

## 2017-04-07 MED ORDER — SODIUM CHLORIDE 0.9 % IR SOLN
Status: DC | PRN
  Administered 2017-04-07: 9000 mL

## 2017-04-07 MED ORDER — MIDAZOLAM HCL 5 MG/5ML IJ SOLN
INTRAMUSCULAR | Status: DC | PRN
  Administered 2017-04-07: 1 mg via INTRAVENOUS

## 2017-04-07 MED ORDER — LIDOCAINE HCL (CARDIAC) 20 MG/ML IV SOLN
INTRAVENOUS | Status: DC | PRN
  Administered 2017-04-07: 25 mg via INTRATRACHEAL
  Administered 2017-04-07: 5 mg via INTRAVENOUS

## 2017-04-07 MED ORDER — LIDOCAINE-EPINEPHRINE (PF) 2 %-1:200000 IJ SOLN
INTRAMUSCULAR | Status: AC | PRN
  Administered 2017-04-07: 10 mL

## 2017-04-07 MED ORDER — CIPROFLOXACIN IN D5W 400 MG/200ML IV SOLN
400.0000 mg | Freq: Two times a day (BID) | INTRAVENOUS | Status: AC
  Administered 2017-04-07: 400 mg via INTRAVENOUS
  Filled 2017-04-07: qty 200

## 2017-04-07 MED ORDER — OXYBUTYNIN CHLORIDE 5 MG PO TABS: 5.0000 mg | ORAL_TABLET | Freq: Three times a day (TID) | ORAL | Status: DC | PRN

## 2017-04-07 MED ORDER — FENTANYL CITRATE (PF) 100 MCG/2ML IJ SOLN
INTRAMUSCULAR | Status: AC | PRN
  Administered 2017-04-07 (×2): 50 ug via INTRAVENOUS

## 2017-04-07 MED ORDER — ONDANSETRON HCL 4 MG/2ML IJ SOLN
INTRAMUSCULAR | Status: DC | PRN
  Administered 2017-04-07: 4 mg via INTRAVENOUS

## 2017-04-07 MED ORDER — IOPAMIDOL (ISOVUE-300) INJECTION 61%
INTRAVENOUS | Status: AC
  Administered 2017-04-07: 20 mL
  Filled 2017-04-07: qty 50

## 2017-04-07 MED ORDER — IOPAMIDOL (ISOVUE-300) INJECTION 61%
15.0000 mL | Freq: Once | INTRAVENOUS | Status: AC | PRN
  Administered 2017-04-07: 20 mL

## 2017-04-07 MED ORDER — OXYCODONE HCL 10 MG PO TABS: 10.0000 mg | ORAL_TABLET | ORAL | 0 refills | Status: DC | PRN

## 2017-04-07 MED ORDER — LIDOCAINE-EPINEPHRINE 2 %-1:200000 IJ SOLN
INTRAMUSCULAR | Status: AC
Start: 2017-04-07 — End: 2017-04-07
  Filled 2017-04-07: qty 20

## 2017-04-07 MED ORDER — MIDAZOLAM HCL 2 MG/2ML IJ SOLN
INTRAMUSCULAR | Status: AC | PRN
  Administered 2017-04-07 (×2): 1 mg via INTRAVENOUS

## 2017-04-07 MED ORDER — FENTANYL CITRATE (PF) 100 MCG/2ML IJ SOLN: 25.0000 ug | INTRAMUSCULAR | Status: DC | PRN

## 2017-04-07 MED ORDER — ACETAMINOPHEN 10 MG/ML IV SOLN
INTRAVENOUS | Status: AC
  Filled 2017-04-07: qty 100

## 2017-04-07 MED ORDER — PHENYLEPHRINE HCL 10 MG/ML IJ SOLN
INTRAMUSCULAR | Status: DC | PRN
  Administered 2017-04-07 (×3): 120 ug via INTRAVENOUS

## 2017-04-07 MED ORDER — PROPOFOL 10 MG/ML IV BOLUS
INTRAVENOUS | Status: AC
  Filled 2017-04-07: qty 40

## 2017-04-07 MED ORDER — INSULIN ASPART 100 UNIT/ML ~~LOC~~ SOLN
SUBCUTANEOUS | Status: AC
  Administered 2017-04-07: 5 [IU] via INTRAVENOUS
  Filled 2017-04-07: qty 1

## 2017-04-07 MED ORDER — SODIUM CHLORIDE 0.9 % IV SOLN
INTRAVENOUS | Status: DC
  Administered 2017-04-07 – 2017-04-08 (×2): via INTRAVENOUS

## 2017-04-07 MED ORDER — FENTANYL CITRATE (PF) 100 MCG/2ML IJ SOLN
INTRAMUSCULAR | Status: AC
  Filled 2017-04-07: qty 4

## 2017-04-07 MED ORDER — ONDANSETRON HCL 4 MG/2ML IJ SOLN: 4.0000 mg | INTRAMUSCULAR | Status: DC | PRN

## 2017-04-07 MED ORDER — FENTANYL CITRATE (PF) 100 MCG/2ML IJ SOLN
INTRAMUSCULAR | Status: DC | PRN
  Administered 2017-04-07 (×2): 50 ug via INTRAVENOUS
  Administered 2017-04-07: 100 ug via INTRAVENOUS

## 2017-04-07 MED ORDER — ONDANSETRON HCL 4 MG/2ML IJ SOLN
INTRAMUSCULAR | Status: AC
  Filled 2017-04-07: qty 4

## 2017-04-07 MED ORDER — MIDAZOLAM HCL 2 MG/2ML IJ SOLN
INTRAMUSCULAR | Status: AC
  Filled 2017-04-07: qty 6

## 2017-04-07 SURGICAL SUPPLY — 46 items
APPLICATOR SURGIFLO ENDO (HEMOSTASIS) ×3 IMPLANT
BAG URINE DRAINAGE (UROLOGICAL SUPPLIES) IMPLANT
BASKET ZERO TIP NITINOL 2.4FR (BASKET) IMPLANT
BENZOIN TINCTURE PRP APPL 2/3 (GAUZE/BANDAGES/DRESSINGS) ×3 IMPLANT
BLADE SURG 15 STRL LF DISP TIS (BLADE) ×2 IMPLANT
BLADE SURG 15 STRL SS (BLADE) ×1
CATH FOLEY 2W COUNCIL 20FR 5CC (CATHETERS) IMPLANT
CATH FOLEY 2WAY SLVR  5CC 18FR (CATHETERS)
CATH FOLEY 2WAY SLVR 5CC 18FR (CATHETERS) IMPLANT
CATH IMAGER II 65CM (CATHETERS) IMPLANT
CATH X-FORCE N30 NEPHROSTOMY (TUBING) ×3 IMPLANT
COVER SURGICAL LIGHT HANDLE (MISCELLANEOUS) ×3 IMPLANT
DRAPE C-ARM 42X120 X-RAY (DRAPES) ×3 IMPLANT
DRAPE LINGEMAN PERC (DRAPES) ×3 IMPLANT
DRAPE SURG IRRIG POUCH 19X23 (DRAPES) ×3 IMPLANT
DRSG PAD ABDOMINAL 8X10 ST (GAUZE/BANDAGES/DRESSINGS) ×6 IMPLANT
DRSG TEGADERM 8X12 (GAUZE/BANDAGES/DRESSINGS) IMPLANT
FIBER LASER FLEXIVA 1000 (UROLOGICAL SUPPLIES) IMPLANT
FIBER LASER FLEXIVA 365 (UROLOGICAL SUPPLIES) IMPLANT
FIBER LASER FLEXIVA 550 (UROLOGICAL SUPPLIES) IMPLANT
FIBER LASER TRAC TIP (UROLOGICAL SUPPLIES) IMPLANT
GAUZE SPONGE 4X4 12PLY STRL (GAUZE/BANDAGES/DRESSINGS) ×3 IMPLANT
GLOVE BIOGEL M 8.0 STRL (GLOVE) ×3 IMPLANT
GOWN STRL REUS W/TWL XL LVL3 (GOWN DISPOSABLE) ×3 IMPLANT
GUIDEWIRE AMPLAZ .035X145 (WIRE) ×9 IMPLANT
GUIDEWIRE STR DUAL SENSOR (WIRE) ×3 IMPLANT
HOLDER FOLEY CATH W/STRAP (MISCELLANEOUS) ×3 IMPLANT
KIT BASIN OR (CUSTOM PROCEDURE TRAY) ×3 IMPLANT
MANIFOLD NEPTUNE II (INSTRUMENTS) ×3 IMPLANT
NS IRRIG 1000ML POUR BTL (IV SOLUTION) ×3 IMPLANT
PACK CYSTO (CUSTOM PROCEDURE TRAY) ×3 IMPLANT
PROBE LITHOCLAST ULTRA 3.8X403 (UROLOGICAL SUPPLIES) ×3 IMPLANT
PROBE PNEUMATIC 1.0MMX570MM (UROLOGICAL SUPPLIES) ×3 IMPLANT
SET IRRIG Y TYPE TUR BLADDER L (SET/KITS/TRAYS/PACK) IMPLANT
SHEATH PEELAWAY SET 9 (SHEATH) ×3 IMPLANT
STENT URET 6FRX24 CONTOUR (STENTS) ×3 IMPLANT
STONE CATCHER W/TUBE ADAPTER (UROLOGICAL SUPPLIES) ×3 IMPLANT
SURGIFLO W/THROMBIN 8M KIT (HEMOSTASIS) ×3 IMPLANT
SUT MNCRL AB 4-0 PS2 18 (SUTURE) IMPLANT
SUT SILK 2 0 30  PSL (SUTURE)
SUT SILK 2 0 30 PSL (SUTURE) IMPLANT
SYR 10ML LL (SYRINGE) ×3 IMPLANT
SYR 20CC LL (SYRINGE) ×6 IMPLANT
TOWEL OR NON WOVEN STRL DISP B (DISPOSABLE) ×3 IMPLANT
TRAY FOLEY W/METER SILVER 16FR (SET/KITS/TRAYS/PACK) IMPLANT
TUBING CONNECTING 10 (TUBING) ×6 IMPLANT

## 2017-04-07 NOTE — Op Note (Signed)
PATIENT:  Roger Young  PRE-OPERATIVE DIAGNOSIS:   POST-OPERATIVE DIAGNOSIS: Same  PROCEDURE: 1. Percutaneous nephrostomy sheath placement. 2.  Right/left percutaneous nephrolithotomy (2.1 cm.) 3. Antegrade double-J stent placement.  SURGEON:  Garnett FarmMark C Vince Ainsley  INDICATION: Roger Young is a 76 year old male with a 2.1 cm right renal calculus. This was found after he experienced gross hematuria. No other abnormality of the collecting system kidneys or bladder was noted cystoscopically. He is brought to the operating room today for management of his right renal calculus.  ANESTHESIA:  General  EBL:  Minimal  DRAINS: 6 French, 24 cm double-J stent in the right ureter  LOCAL MEDICATIONS USED:  None  SPECIMEN:  Stone given to patient  Description of procedure: After informed consent the patient was taken to the operating room and administered general endotracheal anesthesia. Once fully anesthetized the patient had an 1018 French Foley catheter placed It was then moved from the stretcher onto the operating room table in a prone position with bony prominences padded and chest pads in place. The flank with exiting nephrostomy catheter was then sterilely prepped and draped in standard fashion. An official timeout was then performed.  Using the existing nephrostomy catheter access I passed a 0.038 inch floppy tipped guidewire down the ureter into the bladder under fluoroscopy.  This was left in place and the nephrostomy catheter was removed.  A transverse incision was made over the guidewire and a peel-away coaxial catheter was then passed over the guidewire and down the ureter under fluoroscopy.  The inner portion of the coaxial system was then removed and a second guidewire was passed through this catheter and down the ureter into the bladder under fluoroscopy.  The coaxial catheter was then removed and one of the guidewires was secured to the drape as a safety guidewire and the  second guidewire was used as a working guidewire.  The NephroMax nephrostomy dilating balloon was then passed over the working guidewire into the area of the renal pelvis under fluoroscopy.  It was then inflated using dilute contrast under fluoroscopy until the balloon was fully inflated.  I then passed the 28 French nephrostomy access sheath over the balloon into the area of the renal pelvis under fluoroscopy and then deflated the balloon and removed the dilating balloon.  The 4026 French rigid nephroscope was then passed under direct vision through the nephrostomy access sheath.  Clotted blood was evacuated and the stone was identified. I used the Music therapistwiss lithoclast to fragment the stone. As this occurred I used the 2 prong graspers to grasp the stone fragments and extract these. All visible stone fragments were removed and I then switched to the flexible cystoscope and evaluated each calyx and found no further stone fragments.  I backloaded the nephroscope over the guidewire and passed the double-J stent into the bladder and as I began to remove the guidewire good curl was noted in the bladder. I positioned the proximal end of the double-J stent in the renal pelvis using 2 prong graspers and again confirmed good position in the bladder as well.  I then measured the distance to the level of the renal parenchyma and removed all fluid from the kidney with suction. At I used FloSeal in place this at the depth previously measured through the access sheath and began injecting as I removed the access sheath. Once this was completed there was no active bleeding occurring. I therefore closed the skin with running 4-0 subcuticular Monocryl suture and applied a sterile  gauze dressing. The patient was then awakened and taken to recovery room in stable and satisfactory condition. He tolerated the procedure well no intraoperative complications.    PLAN OF CARE: Discharge to home after an overnight stay.  PATIENT  DISPOSITION:  PACU - hemodynamically stable. Right partial staghorn calculus

## 2017-04-07 NOTE — Discharge Instructions (Signed)

## 2017-04-07 NOTE — Anesthesia Preprocedure Evaluation (Addendum)
Anesthesia Evaluation  Patient identified by MRN, date of birth, ID band Patient awake    Reviewed: Allergy & Precautions, H&P , Patient's Chart, lab work & pertinent test results, reviewed documented beta blocker date and time   Airway Mallampati: II  TM Distance: >3 FB Neck ROM: full    Dental no notable dental hx.    Pulmonary    Pulmonary exam normal breath sounds clear to auscultation       Cardiovascular hypertension,  Rhythm:regular Rate:Normal     Neuro/Psych    GI/Hepatic   Endo/Other  diabetes, Poorly Controlled, Type 2  Renal/GU      Musculoskeletal   Abdominal   Peds  Hematology   Anesthesia Other Findings EF 45-50%; No evidence of CHF today HTN Pacer check in Dec- good fxn Hx of A fib; sinus today BS 307 in DS will give insulin Lytes okay Hb okay  Reproductive/Obstetrics                            Anesthesia Physical Anesthesia Plan  ASA: II  Anesthesia Plan: General   Post-op Pain Management:    Induction: Intravenous  PONV Risk Score and Plan: 1 and Ondansetron, Dexamethasone and Propofol  Airway Management Planned: Oral ETT  Additional Equipment:   Intra-op Plan:   Post-operative Plan: Extubation in OR  Informed Consent: I have reviewed the patients History and Physical, chart, labs and discussed the procedure including the risks, benefits and alternatives for the proposed anesthesia with the patient or authorized representative who has indicated his/her understanding and acceptance.   Dental advisory given  Plan Discussed with: CRNA and Surgeon  Anesthesia Plan Comments: (  )        Anesthesia Quick Evaluation

## 2017-04-07 NOTE — Consult Note (Signed)
Chief Complaint: Patient was seen in consultation today for right percutaneous nephrostomy/nephroureteral catheter placement  Referring Physician(s): Ottelin,M  Supervising Physician: Simonne ComeWatts, John  [facility]  [facility]BA  History of Present Illness: Roger Young is a 76 y.o. male with history of diabetes, hypertension, CHF, coronary artery disease and prior CABG as well as a right partial staghorn renal calculus.He presents today for right percutaneous nephrostomy/nephroureteral catheter placement prior to nephrolithotomy.  Past Medical History:  Diagnosis Date  . Back pain    lower back with pain shooting down right leg  . CHF (congestive heart failure) (HCC)   . Chronic ischemic heart disease, unspecified   . Essential hypertension, benign   . History of kidney stones   . Other heart block   . Other testicular hypofunction   . Type II or unspecified type diabetes mellitus without mention of complication, not stated as uncontrolled     Past Surgical History:  Procedure Laterality Date  . CORONARY ARTERY BYPASS GRAFT    . LEFT HEART CATHETERIZATION WITH CORONARY ANGIOGRAM N/A 06/17/2014   Procedure: LEFT HEART CATHETERIZATION WITH CORONARY ANGIOGRAM;  Surgeon: Pamella PertJagadeesh R Ganji, MD;  [facility];  Service: Cardiovascular;  Laterality: N/A;    Allergies: Patient has no known allergies.  Medications: Prior to Admission medications   Medication Sig Start Date End Date Taking? Authorizing Provider  apixaban (ELIQUIS) 5 MG TABS tablet Take 5 mg by mouth 2 (two) times daily.   Yes [provider]  Dulaglutide (TRULICITY) 1.5 MG/0.5ML SOPN Inject 0.5 mLs into the skin every Saturday.   Yes [provider]  lisinopril (PRINIVIL,ZESTRIL) 5 MG tablet Take 5 mg by mouth daily.   Yes [provider]  metFORMIN (GLUCOPHAGE) 1000 MG tablet Take 1 tablet (1,000 mg total) by mouth 2 (two) times daily with a meal. 06/19/14  Yes  Yates DecampGanji, Jay, MD  metoprolol (LOPRESSOR) 50 MG tablet Take 50 mg by mouth 2 (two) times daily.   Yes [provider]  Polyethyl Glycol-Propyl Glycol (SYSTANE) 0.4-0.3 % SOLN Place 2-3 drops into both eyes daily.   Yes [provider]  rosuvastatin (CRESTOR) 20 MG tablet Take 20 mg by mouth daily.   Yes [provider]     Family History  Problem Relation Age of Onset  . Heart attack Other   . Hypertension Other     Social History   Social History  . Marital status: Legally Separated    Spouse name: N/A  . Number of children: N/A  . Years of education: N/A   Social History Main Topics  . Smoking status: Never Smoker  . Smokeless tobacco: Current User    Types: Chew     Comment: Advised to quit.  . Alcohol use 0.0 - 0.5 oz/week     Comment: rare  . Drug use: No  . Sexual activity: Not Asked   Other Topics Concern  . None   Social History Narrative  . None      Review of Systems Currently denies fever, headache, chest pain, dyspnea, cough, abdominal pain, nausea, vomiting or bloody stools. He does have back pain, intermittent hematuria as well as buttocks paresthesias.  Vital Signs: BP (!) 147/86 (BP Location: Right Arm)   Pulse 73   Temp 97.6 F (36.4 C) (Oral)   Resp 18   Ht 5\' 6"  (1.676 m)   Wt 179 lb (81.2 kg)   SpO2 100%   BMI 28.89 kg/m  Physical Exam Awake, alert. Chest clear to auscultation bilaterally. Heart with regular rate and rhythm. Abdomen soft, positive bowel sounds, nontender. No lower extremity edema.  Mallampati Score:     Imaging: No results found.  Labs:  CBC:  Recent Labs  04/04/17 1049 04/07/17 0743  WBC 7.6 7.6  HGB 15.2 15.5  HCT 45.0 46.2  PLT 207 214    COAGS:  Recent Labs  04/07/17 0743  INR 1.04  APTT 27    BMP:  Recent Labs  04/04/17 1049 04/07/17 0743  NA 136 136  K 4.8 4.3  CL 98* 100*  CO2 27 25  GLUCOSE 340* 329*  BUN 15 20  CALCIUM 9.8 9.8  CREATININE 0.92 1.04    GFRNONAA >60 >60  GFRAA >60 >60    LIVER FUNCTION TESTS: No results for input(s): BILITOT, AST, ALT, ALKPHOS, PROT, ALBUMIN in the last 8760 hours.  TUMOR MARKERS: No results for input(s): AFPTM, CEA, CA199, CHROMGRNA in the last 8760 hours.  Assessment and Plan: 76 y.o. male with history of diabetes, hypertension, CHF, coronary artery disease and prior CABG as well as a right partial staghorn renal calculus.He presents today for right percutaneous nephrostomy/nephroureteral catheter placement prior to nephrolithotomy.Risks and benefits discussed with the patient including, but not limited to infection, bleeding, significant bleeding causing loss or decrease in renal function or damage to adjacent structures. All of the patient's questions were answered, patient is agreeable to proceed.Consent signed and in chart. Pt's CBG this morning 329. He has not taken his metformin in several days.Recommend that he continue diabetic medication unless instructed otherwise by clinicians. May require insulin while in-house.      Thank you for this interesting consult.  I greatly enjoyed meeting Roger Young and look forward to participating in their care.  A copy of this report was sent to the requesting provider on this date.  Electronically Signed: D. Jeananne Rama, PA-C 04/07/2017, 8:53 AM   I spent a total of  25 minutes   in face to face in clinical consultation, greater than 50% of which was counseling/coordinating care for right percutaneous nephrostomy/nephroureteral catheter placement

## 2017-04-07 NOTE — Anesthesia Procedure Notes (Addendum)
Procedure Name: Intubation Date/Time: 04/07/2017 11:21 AM Performed by: Lissa Morales Pre-anesthesia Checklist: Patient identified, Emergency Drugs available, Suction available and Patient being monitored Patient Re-evaluated:Patient Re-evaluated prior to inductionOxygen Delivery Method: Circle system utilized Preoxygenation: Pre-oxygenation with 100% oxygen Intubation Type: IV induction Ventilation: Mask ventilation without difficulty Laryngoscope Size: Mac and 4 Grade View: Grade II Tube type: Oral Tube size: 7.5 mm Number of attempts: 1 Airway Equipment and Method: Stylet and Oral airway Placement Confirmation: ETT inserted through vocal cords under direct vision,  positive ETCO2 and breath sounds checked- equal and bilateral Secured at: 21 cm Tube secured with: Tape Dental Injury: Teeth and Oropharynx as per pre-operative assessment

## 2017-04-07 NOTE — Transfer of Care (Signed)
Immediate Anesthesia Transfer of Care Note  Patient: Roger Young  Procedure(s) Performed: Procedure(s): NEPHROLITHOTOMY PERCUTANEOUS RIGHT (Right)  Patient Location: PACU  Anesthesia Type:General  Level of Consciousness: awake, alert  and patient cooperative  Airway & Oxygen Therapy: Patient Spontanous Breathing and Patient connected to face mask oxygen  Post-op Assessment: Report given to RN, Post -op Vital signs reviewed and stable and Patient moving all extremities X 4  Post vital signs: stable  Last Vitals:  Vitals:   04/07/17 0728  BP: (!) 147/86  Pulse: 73  Resp: 18  Temp: 36.4 C    Last Pain:  Vitals:   04/07/17 0728  TempSrc: Oral      Patients Stated Pain Goal: 4 (04/07/17 1058)  Complications: No apparent anesthesia complications

## 2017-04-07 NOTE — Procedures (Signed)
Pre Procedure Dx: Nephrolithiasis Post Procedural Dx: Same  Successful fluoroscopic guided placement of a right sided nephroureteral catheter  with end terminating within the urinary bladder.  EBL: Minimal  Complications: None immediate.  Katherina RightJay Shayla Heming, MD Pager #: [phone]

## 2017-04-08 DIAGNOSIS — N200 Calculus of kidney: Secondary | ICD-10-CM | POA: Diagnosis not present

## 2017-04-08 MED ORDER — SODIUM CHLORIDE 0.9 % IV SOLN
INTRAVENOUS | Status: DC
  Administered 2017-04-08: 09:00:00 via INTRAVENOUS

## 2017-04-08 NOTE — Progress Notes (Signed)
Urology Progress Note  1 Day Post-Op  Subjective: The patient is doing well.  No nausea or vomiting. Pain is adequately controlled.  Objective: Vital signs in last 24 hours: Temp:  [97.4 F (36.3 C)-98.9 F (37.2 C)] 98.9 F (37.2 C) (06/16 0549) Pulse Rate:  [58-62] 60 (06/16 0549) Resp:  [8-18] 16 (06/16 0549) BP: (110-165)/(53-92) 116/53 (06/16 0549) SpO2:  [98 %-100 %] 100 % (06/16 0549) Weight:  [81.2 kg (179 lb 0.2 oz)] 81.2 kg (179 lb 0.2 oz) (06/15 1426)  Intake/Output from previous day: 06/15 0701 - 06/16 0700 In: 4602.5 [P.O.:240; I.V.:3862.5; IV Piggyback:500] Out: 1575 [Urine:1525; Blood:50] Intake/Output this shift: No intake/output data recorded.  Physical Exam:  General: Alert and oriented. CV: RRR Lungs: Clear bilaterally. GI: Soft, Nondistended. Incisions: Clean, dry, and intact Urine: Clear yellow in foley Extremities: Nontender, no erythema, no edema.  Lab Results:  Recent Labs  04/07/17 0743  HGB 15.5  HCT 46.2      Assessment/Plan: POD#1 s/p R tubeless PCNL (stent only). No issues.  1. D/c foley 2. Discharge after voids 3. Follow-up: Patient will call office to have stent removed Friday.  Seen with Dr. Ronne BinningMcKenzie.   LOS: 0 days   Merlie Noga R 04/08/2017, 9:18 AM

## 2017-04-08 NOTE — Discharge Summary (Signed)
Date of admission: 04/07/2017  Date of discharge: 04/08/2017  Admission diagnosis: Right renal stone  Discharge diagnosis: Same  History and Physical: For full details, please see admission history and physical. Briefly, Roger Young is a 76 y.o. male with right renal stone. After discussing management/treatment options, he  elected to proceed with surgical treatment.  Hospital Course: Roger Young was taken to the operating room on 04/07/2017 and underwent a right PCNL. He tolerated this procedure well and without complications. Postoperatively, the patient was able to be transferred to a regular hospital room following recovery from anesthesia.  They were able to begin ambulating the night of surgery and remained hemodynamically stable overnight.  On POD#1, his foley was removed. He was able to void with PVR <100cc. They was transitioned to oral pain medication, tolerated a regular diet, and had met all discharge criteria and was able to be discharged home on POD#1.  Laboratory values:   Recent Labs  04/07/17 0743  HGB 15.5  HCT 46.2    Disposition: Home  Discharge instruction: They were instructed to be ambulatory but to refrain from heavy lifting, strenuous activity, or driving.  Discharge medications:   Allergies as of 04/08/2017   No Known Allergies     Medication List    TAKE these medications   ELIQUIS 5 MG Tabs tablet Generic drug:  apixaban Take 5 mg by mouth 2 (two) times daily.   lisinopril 5 MG tablet Commonly known as:  PRINIVIL,ZESTRIL Take 5 mg by mouth daily.   metFORMIN 1000 MG tablet Commonly known as:  GLUCOPHAGE Take 1 tablet (1,000 mg total) by mouth 2 (two) times daily with a meal.   metoprolol tartrate 50 MG tablet Commonly known as:  LOPRESSOR Take 50 mg by mouth 2 (two) times daily.   Oxycodone HCl 10 MG Tabs Take 1 tablet (10 mg total) by mouth every 4 (four) hours as needed.   phenazopyridine 200 MG tablet Commonly  known as:  PYRIDIUM Take 1 tablet (200 mg total) by mouth 3 (three) times daily as needed for pain.   rosuvastatin 20 MG tablet Commonly known as:  CRESTOR Take 20 mg by mouth daily.   SYSTANE 0.4-0.3 % Soln Generic drug:  Polyethyl Glycol-Propyl Glycol Place 2-3 drops into both eyes daily.   TRULICITY 1.5 MB/8.4QT Sopn Generic drug:  Dulaglutide Inject 0.5 mLs into the skin every Saturday.       Followup: He will follow-up next week for stent removal.

## 2017-04-10 ENCOUNTER — Encounter (HOSPITAL_COMMUNITY): Payer: Self-pay | Admitting: Urology

## 2017-04-10 LAB — GLUCOSE, CAPILLARY
Glucose-Capillary: 248 mg/dL — ABNORMAL HIGH (ref 65–99)
Glucose-Capillary: 195 mg/dL — ABNORMAL HIGH (ref 65–99)
Glucose-Capillary: 149 mg/dL — ABNORMAL HIGH (ref 65–99)

## 2017-04-10 NOTE — Anesthesia Postprocedure Evaluation (Signed)
Anesthesia Post Note  Patient: Roger Young  Procedure(s) Performed: Procedure(s) (LRB): NEPHROLITHOTOMY PERCUTANEOUS RIGHT (Right)     Patient location during evaluation: PACU Anesthesia Type: General Level of consciousness: awake and alert Pain management: pain level controlled Vital Signs Assessment: post-procedure vital signs reviewed and stable Respiratory status: spontaneous breathing, nonlabored ventilation, respiratory function stable and patient connected to nasal cannula oxygen Cardiovascular status: blood pressure returned to baseline and stable Postop Assessment: no signs of nausea or vomiting Anesthetic complications: no    Last Vitals:  Vitals:   04/08/17 0549 04/08/17 1011  BP: (!) 116/53 111/63  Pulse: 60 62  Resp: 16 14  Temp: 37.2 C 37.2 C    Last Pain:  Vitals:   04/08/17 1011  TempSrc: Oral  PainSc:                  Jiles GarterJACKSON,[name]

## 2017-04-13 ENCOUNTER — Encounter (HOSPITAL_COMMUNITY): Payer: Self-pay | Admitting: Emergency Medicine

## 2017-04-13 DIAGNOSIS — E119 Type 2 diabetes mellitus without complications: Secondary | ICD-10-CM | POA: Insufficient documentation

## 2017-04-13 DIAGNOSIS — I110 Hypertensive heart disease with heart failure: Secondary | ICD-10-CM | POA: Diagnosis not present

## 2017-04-13 DIAGNOSIS — Z7901 Long term (current) use of anticoagulants: Secondary | ICD-10-CM | POA: Insufficient documentation

## 2017-04-13 DIAGNOSIS — T8384XA Pain from genitourinary prosthetic devices, implants and grafts, initial encounter: Secondary | ICD-10-CM | POA: Diagnosis not present

## 2017-04-13 DIAGNOSIS — R1032 Left lower quadrant pain: Secondary | ICD-10-CM | POA: Diagnosis present

## 2017-04-13 DIAGNOSIS — Y658 Other specified misadventures during surgical and medical care: Secondary | ICD-10-CM | POA: Insufficient documentation

## 2017-04-13 DIAGNOSIS — I509 Heart failure, unspecified: Secondary | ICD-10-CM | POA: Insufficient documentation

## 2017-04-13 DIAGNOSIS — R310 Gross hematuria: Secondary | ICD-10-CM | POA: Diagnosis not present

## 2017-04-13 DIAGNOSIS — Z951 Presence of aortocoronary bypass graft: Secondary | ICD-10-CM | POA: Insufficient documentation

## 2017-04-13 NOTE — ED Triage Notes (Signed)
Pt reports having blood in urine and difficulty urinating for the last week. Pt reports last bowel movement 2 days ago. Pt had surgery for kidney stone on right side on 04/07/17

## 2017-04-14 ENCOUNTER — Emergency Department (HOSPITAL_COMMUNITY)
Admission: EM | Admit: 2017-04-14 | Discharge: 2017-04-14 | Disposition: A | Discharge status: Home / Self Care | Payer: Medicare Other | Attending: Emergency Medicine | Admitting: Emergency Medicine

## 2017-04-14 ENCOUNTER — Emergency Department (HOSPITAL_COMMUNITY): Payer: Medicare Other

## 2017-04-14 DIAGNOSIS — T8384XA Pain from genitourinary prosthetic devices, implants and grafts, initial encounter: Secondary | ICD-10-CM

## 2017-04-14 DIAGNOSIS — R310 Gross hematuria: Secondary | ICD-10-CM

## 2017-04-14 LAB — URINALYSIS, ROUTINE W REFLEX MICROSCOPIC
BILIRUBIN URINE: NEGATIVE
Glucose, UA: >=500 mg/dL — AB
KETONES UR: 20 mg/dL — AB
LEUKOCYTES UA: NEGATIVE
Nitrite: POSITIVE — AB
Protein, ur: 100 mg/dL — AB
SQUAMOUS EPITHELIAL / LPF: NONE SEEN
Specific Gravity, Urine: 1.023 (ref 1.005–1.030)
pH: 6 (ref 5.0–8.0)

## 2017-04-14 LAB — CBC WITH DIFFERENTIAL/PLATELET
BASOS ABS: 0 10*3/uL (ref 0.0–0.1)
BASOS PCT: 0 %
EOS ABS: 0 10*3/uL (ref 0.0–0.7)
Eosinophils Relative: 0 %
HCT: 39 % (ref 39.0–52.0)
HEMOGLOBIN: 13.5 g/dL (ref 13.0–17.0)
Lymphocytes Relative: 15 %
Lymphs Abs: 1.7 10*3/uL (ref 0.7–4.0)
MCH: 29.7 pg (ref 26.0–34.0)
MCHC: 34.6 g/dL (ref 30.0–36.0)
MCV: 85.7 fL (ref 78.0–100.0)
Monocytes Absolute: 1.5 10*3/uL — ABNORMAL HIGH (ref 0.1–1.0)
Monocytes Relative: 14 %
NEUTROS PCT: 71 %
Neutro Abs: 7.9 10*3/uL — ABNORMAL HIGH (ref 1.7–7.7)
Platelets: 320 10*3/uL (ref 150–400)
RBC: 4.55 MIL/uL (ref 4.22–5.81)
RDW: 13.1 % (ref 11.5–15.5)
WBC: 11.2 10*3/uL — AB (ref 4.0–10.5)

## 2017-04-14 LAB — BASIC METABOLIC PANEL
ANION GAP: 10 (ref 5–15)
BUN: 20 mg/dL (ref 6–20)
CHLORIDE: 97 mmol/L — AB (ref 101–111)
CO2: 29 mmol/L (ref 22–32)
Calcium: 10.2 mg/dL (ref 8.9–10.3)
Creatinine, Ser: 1.39 mg/dL — ABNORMAL HIGH (ref 0.61–1.24)
GFR calc non Af Amer: 48 mL/min — ABNORMAL LOW (ref >60)
GFR, EST AFRICAN AMERICAN: 56 mL/min — AB (ref >60)
Glucose, Bld: 283 mg/dL — ABNORMAL HIGH (ref 65–99)
POTASSIUM: 3.7 mmol/L (ref 3.5–5.1)
Sodium: 136 mmol/L (ref 135–145)

## 2017-04-14 MED ORDER — PANTOPRAZOLE SODIUM 40 MG IV SOLR
40.0000 mg | Freq: Once | INTRAVENOUS | Status: AC
  Administered 2017-04-14: 40 mg via INTRAVENOUS
  Filled 2017-04-14: qty 40

## 2017-04-14 NOTE — ED Provider Notes (Signed)
[facility]-EMERGENCY DEPT Provider Note: Lowella Dell, MD, FACEP  CSN: [id number] MRN: [id number] ARRIVAL: 04/13/17 at 2308 ROOM: WA11/WA11   CHIEF COMPLAINT  Hematuria   HISTORY OF PRESENT ILLNESS  Roger Young is a 76 y.o. male who had surgery on June 15 for removal of right ureteral stone. He has an indwelling ureteral stent in place. He has an appointment later this morning for removal of that stent. He is here with hematuria and difficulty urinating for the past week which acutely worsened yesterday. He describes the urine as grossly bloody. About 4 PM yesterday afternoon he developed severe right lower quadrant abdominal pain with nausea. That lasted until he got here after which time it eased up significantly. He is in minimal pain now. He also complains of not having a bowel movement in 2 days. He denies a fever.   Past Medical History:  Diagnosis Date  . Back pain    lower back with pain shooting down right leg  . CHF (congestive heart failure) (HCC)   . Chronic ischemic heart disease, unspecified   . Essential hypertension, benign   . History of kidney stones   . Other heart block   . Other testicular hypofunction   . Type II or unspecified type diabetes mellitus without mention of complication, not stated as uncontrolled     Past Surgical History:  Procedure Laterality Date  . CORONARY ARTERY BYPASS GRAFT    . IR URETERAL STENT RIGHT NEW ACCESS W/O SEP NEPHROSTOMY CATH  04/07/2017  . LEFT HEART CATHETERIZATION WITH CORONARY ANGIOGRAM N/A 06/17/2014   Procedure: LEFT HEART CATHETERIZATION WITH CORONARY ANGIOGRAM;  Surgeon: Pamella Pert, MD;  Location: [facility] CATH LAB;  Service: Cardiovascular;  Laterality: N/A;  . NEPHROLITHOTOMY Right 04/07/2017   Procedure: NEPHROLITHOTOMY PERCUTANEOUS RIGHT;  Surgeon: Ihor Gully, MD;  Location: [facility] ORS;  Service: Urology;  Laterality: Right;    Family History  Problem Relation Age of Onset  . Heart attack Other   .  Hypertension Other     Social History  Substance Use Topics  . Smoking status: Never Smoker  . Smokeless tobacco: Current User    Types: Chew     Comment: Advised to quit.  . Alcohol use 0.0 - 0.5 oz/week     Comment: rare    Prior to Admission medications   Medication Sig Start Date End Date Taking? Authorizing Provider  apixaban (ELIQUIS) 5 MG TABS tablet Take 5 mg by mouth 2 (two) times daily.   Yes [provider]  Dulaglutide (TRULICITY) 1.5 MG/0.5ML SOPN Inject 0.5 mLs into the skin every Wednesday.    Yes [provider]  lisinopril (PRINIVIL,ZESTRIL) 5 MG tablet Take 5 mg by mouth daily after breakfast.    Yes [provider]  metFORMIN (GLUCOPHAGE) 1000 MG tablet Take 1 tablet (1,000 mg total) by mouth 2 (two) times daily with a meal. 06/19/14  Yes Yates Decamp, MD  metoprolol (LOPRESSOR) 50 MG tablet Take 50 mg by mouth 2 (two) times daily.   Yes [provider]  Polyethyl Glycol-Propyl Glycol (SYSTANE) 0.4-0.3 % SOLN Place 2-3 drops into both eyes daily.   Yes [provider]  rosuvastatin (CRESTOR) 20 MG tablet Take 20 mg by mouth every evening.    Yes [provider]  Oxycodone HCl 10 MG TABS Take 1 tablet (10 mg total) by mouth every 4 (four) hours as needed. Patient not taking: Reported on 04/14/2017 04/07/17   Ihor Gully, MD  phenazopyridine (PYRIDIUM)  200 MG tablet Take 1 tablet (200 mg total) by mouth 3 (three) times daily as needed for pain. Patient not taking: Reported on 04/14/2017 04/07/17   Ihor Gullyttelin, Mark, MD    Allergies Patient has no known allergies.   REVIEW OF SYSTEMS  Negative except as noted here or in the History of Present Illness.   PHYSICAL EXAMINATION  Initial Vital Signs Blood pressure (!) 159/84, pulse 62, temperature 97.6 F (36.4 C), temperature source Oral, resp. rate 18, height 5\' 6"  (1.676 m), weight 79.4 kg (175 lb), SpO2 100 %.  Examination General: Well-developed, well-nourished  male in no acute distress; appearance consistent with age of record HENT: normocephalic; atraumatic Eyes: pupils equal, round and reactive to light; extraocular muscles intact Neck: supple Heart: regular rate and rhythm Lungs: clear to auscultation bilaterally Abdomen: soft; nondistended; mild right lower quadrant; no masses or hepatosplenomegaly; bowel sounds present GU: Bladder scan shows about 200 milliliters of urine in the bladder; well healing right nephrostomy incision without signs of infection Extremities: No deformity; full range of motion; pulses normal Neurologic: Awake, alert and oriented; motor function intact in all extremities and symmetric; no facial droop Skin: Warm and dry Psychiatric: Normal mood and affect   RESULTS  Summary of this visit's results, reviewed by myself:   EKG Interpretation  Date/Time:    Ventricular Rate:    PR Interval:    QRS Duration:   QT Interval:    QTC Calculation:   R Axis:     Text Interpretation:        Laboratory Studies: Results for orders placed or performed during the hospital encounter of 04/14/17 (from the past 24 hour(s))  Urinalysis, Routine w reflex microscopic- may I&O cath if menses     Status: Abnormal   Collection Time: 04/14/17  2:07 AM  Result Value Ref Range   Color, Urine YELLOW YELLOW   APPearance CLOUDY (A) CLEAR   Specific Gravity, Urine 1.023 1.005 - 1.030   pH 6.0 5.0 - 8.0   Glucose, UA >=500 (A) NEGATIVE mg/dL   Hgb urine dipstick MODERATE (A) NEGATIVE   Bilirubin Urine NEGATIVE NEGATIVE   Ketones, ur 20 (A) NEGATIVE mg/dL   Protein, ur 478100 (A) NEGATIVE mg/dL   Nitrite POSITIVE (A) NEGATIVE   Leukocytes, UA NEGATIVE NEGATIVE   RBC / HPF TOO NUMEROUS TO COUNT 0 - 5 RBC/hpf   WBC, UA 6-30 0 - 5 WBC/hpf   Bacteria, UA MANY (A) NONE SEEN   Squamous Epithelial / LPF NONE SEEN NONE SEEN   Urine-Other YEAST   CBC with Differential/Platelet     Status: Abnormal   Collection Time: 04/14/17  3:36 AM    Result Value Ref Range   WBC 11.2 (H) 4.0 - 10.5 K/uL   RBC 4.55 4.22 - 5.81 MIL/uL   Hemoglobin 13.5 13.0 - 17.0 g/dL   HCT 29.539.0 62.139.0 - 30.852.0 %   MCV 85.7 78.0 - 100.0 fL   MCH 29.7 26.0 - 34.0 pg   MCHC 34.6 30.0 - 36.0 g/dL   RDW 65.713.1 84.611.5 - 96.215.5 %   Platelets 320 150 - 400 K/uL   Neutrophils Relative % 71 %   Neutro Abs 7.9 (H) 1.7 - 7.7 K/uL   Lymphocytes Relative 15 %   Lymphs Abs 1.7 0.7 - 4.0 K/uL   Monocytes Relative 14 %   Monocytes Absolute 1.5 (H) 0.1 - 1.0 K/uL   Eosinophils Relative 0 %   Eosinophils Absolute 0.0 0.0 - 0.7 K/uL  Basophils Relative 0 %   Basophils Absolute 0.0 0.0 - 0.1 K/uL  Basic metabolic panel     Status: Abnormal   Collection Time: 04/14/17  3:36 AM  Result Value Ref Range   Sodium 136 135 - 145 mmol/L   Potassium 3.7 3.5 - 5.1 mmol/L   Chloride 97 (L) 101 - 111 mmol/L   CO2 29 22 - 32 mmol/L   Glucose, Bld 283 (H) 65 - 99 mg/dL   BUN 20 6 - 20 mg/dL   Creatinine, Ser 1.61 (H) 0.61 - 1.24 mg/dL   Calcium 09.6 8.9 - 04.5 mg/dL   GFR calc non Af Amer 48 (L) >60 mL/min   GFR calc Af Amer 56 (L) >60 mL/min   Anion gap 10 5 - 15   Imaging Studies: Ct Renal Stone Study  Result Date: 04/14/2017 CLINICAL DATA:  76 year old male with history of recent right ureteral stent presenting with hematuria. EXAM: CT ABDOMEN AND PELVIS WITHOUT CONTRAST TECHNIQUE: Multidetector CT imaging of the abdomen and pelvis was performed following the standard protocol without IV contrast. COMPARISON:  Abdominal CT dated 04/07/2017 FINDINGS: Evaluation of this exam is limited in the absence of intravenous contrast. Lower chest: The visualized lung bases are clear. No intra-abdominal free air.  Small free fluid within the pelvis. Hepatobiliary: The liver is grossly unremarkable as visualized. The dome of the liver is not included in the the images and not evaluated. The gallbladder is mildly distended and grossly unremarkable. No calcified stone noted. Pancreas:  Unremarkable. No pancreatic ductal dilatation or surrounding inflammatory changes. Spleen: Normal in size without focal abnormality. Adrenals/Urinary Tract: The adrenal glands are unremarkable. There is evidence of removed prior percutaneous nephrostomy in the posterior aspect of the right kidney. Small low attenuating bubbles through the posterior renal parenchyma in the previously seen FloSeal hemostatic matrix noted which may represent partial absorption of the hemostatic compound or pockets of air. There is extension of small pockets of air from the posterior renal parenchyma into the posterior perinephric fat. A complex fluid collection containing pockets of air has developed in the right posterior pararenal space pararenal space since the prior exam and now measures approximately 19 x 42 mm in axial dimension and approximately 20 cm in length. This fluid appears to communicate with the skin and posterior aspect of the right kidney at the site of prior nephrostomy tube. A right ureteral stent is in similar positioning as the prior CT with proximal tip in the right renal pelvis and distal end within the urinary bladder. There has been interval development of a high attenuating mild right hydronephroureter. Extending from the adrenal collecting system to the distal aspect of the right ureter along the ureteral stent. High attenuating content in the right renal collecting system and ureter may represent blood products, or infected/proteinaceous content. No significant high attenuating content noted within the urinary bladder concerning for stent malfunction. No definite obstructing stone noted within the course of the ureter along the ureteral stent. Uterus increased right perinephric stranding. There is lobulated appearance of the left renal cortex. There is no hydronephrosis or nephrolithiasis on the left. Stomach/Bowel: There is sigmoid diverticulosis without active inflammatory changes. No evidence of bowel  obstruction or active inflammation. Normal appendix. Vascular/Lymphatic: Advanced aortoiliac atherosclerotic disease. Evaluation of the vasculature is limited in the absence of intravenous contrast. No portal venous gas noted. There is no adenopathy. Reproductive: The prostate and seminal vesicles are grossly unremarkable. Other: None Musculoskeletal: Degenerative changes of the spine. No  acute fracture. Old L1 compression deformity. IMPRESSION: 1. Interval development of a complex fluid collection containing air in the right posterior pararenal space extending from the level of the previous percutaneous nephrostomy inferiorly to the lower abdomen along the right psoas muscle. This fluid collection appears to communicate with the site of nephrostomy in the posterior renal parenchyma as well as probable communication with the skin. This most likely represents an infected collection. 2. Interval development of mild right hydronephrosis with findings concerning for malfunction of the right ureteral stent. High attenuating content in the right renal collecting system may represent infected/proteinaceous debris or blood product. No obstructing stone noted along the course of the right ureter. 3. Sigmoid diverticulosis. 4.  Aortic Atherosclerosis (ICD10-I70.0). Electronically Signed   By: Elgie Collard M.D.   On: 04/14/2017 03:12    ED COURSE  Nursing notes and initial vitals signs, including pulse oximetry, reviewed.  Vitals:   04/13/17 2321 04/14/17 0039 04/14/17 0333 04/14/17 0444  BP: (!) 181/93 (!) 159/84 (!) 155/72 (!) 164/73  Pulse: 65 62 60 62  Resp: 16 18 20 20   Temp: 97.6 F (36.4 C)     TempSrc: Oral     SpO2: 97% 100% 100% 97%  Weight: 79.4 kg (175 lb)     Height: 5\' 6"  (1.676 m)      5:04 AM Discussed with Dr. Mena Goes of [facility]. He believes the CT findings are consistent with expected postoperative changes and that the patient's hematuria is likely due to acute bleeding  associated with the ureteral stent. As the patient is afebrile, not having any pain at the present time, and with just a mild leukocytosis he feels the patient is stable for discharge as he has scheduled follow-up in the office this morning at 9 AM.  PROCEDURES    ED DIAGNOSES     ICD-10-CM   1. Gross hematuria R31.0   2. Pain due to ureteral stent, initial encounter [facility]) T83.84XA        Paula Libra, MD 04/14/17 [phone]

## 2018-12-27 DIAGNOSIS — Z45018 Encounter for adjustment and management of other part of cardiac pacemaker: Secondary | ICD-10-CM | POA: Diagnosis not present

## 2018-12-27 DIAGNOSIS — I4891 Unspecified atrial fibrillation: Secondary | ICD-10-CM | POA: Diagnosis not present

## 2018-12-27 DIAGNOSIS — I440 Atrioventricular block, first degree: Secondary | ICD-10-CM | POA: Diagnosis not present

## 2018-12-27 DIAGNOSIS — Z950 Presence of cardiac pacemaker: Secondary | ICD-10-CM | POA: Diagnosis not present

## 2019-03-20 ENCOUNTER — Ambulatory Visit: Payer: Self-pay | Admitting: Cardiology

## 2019-05-07 DIAGNOSIS — Z45018 Encounter for adjustment and management of other part of cardiac pacemaker: Secondary | ICD-10-CM | POA: Diagnosis not present

## 2019-05-07 DIAGNOSIS — I4891 Unspecified atrial fibrillation: Secondary | ICD-10-CM | POA: Diagnosis not present

## 2019-05-07 DIAGNOSIS — I440 Atrioventricular block, first degree: Secondary | ICD-10-CM | POA: Diagnosis not present

## 2019-05-07 DIAGNOSIS — Z950 Presence of cardiac pacemaker: Secondary | ICD-10-CM | POA: Diagnosis not present

## 2019-05-08 ENCOUNTER — Ambulatory Visit: Payer: Medicare Other | Admitting: Cardiology

## 2019-05-08 DIAGNOSIS — Z5329 Procedure and treatment not carried out because of patient's decision for other reasons: Secondary | ICD-10-CM

## 2019-05-08 NOTE — Progress Notes (Deleted)
Primary Physician/Referring:  Tamsen Roers, MD  Patient ID: Roger Young, male    DOB: [birth year], 78 y.o.   MRN: [id number]  Subjective   No chief complaint on file.   HPI: Roger Young  is a 78 y.o. "Roger Young" Roger Young is a Caucasian male with CABG on 10/13/2010. Coronary angiogram performed on 06/17/2014 showed "severe native vessel disease with no lesion amenable to PCI". 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 He is presently on long-term anticoagulation but plans to stop after he runs out of his Eliquis.  Fecal incontenence is now almost resolved and evaluated by Dr. Carol Ada, Metfromin discontinued. He had hematuria diagnosed with staghorn calculus and underwent extraction in June 2018. No further hematurea.  He denies any symptoms of claudication, recent weight changes, TIA. He has mild chronic dyspnea no PND or orthopnea, chest pain. ***  Past Medical History:  Diagnosis Date  . Back pain    lower back with pain shooting down right leg  . CHF (congestive heart failure) ([place])   . Chronic ischemic heart disease, unspecified   . Essential hypertension, benign   . History of kidney stones   . Other heart block   . Other testicular hypofunction   . Type II or unspecified type diabetes mellitus without mention of complication, not stated as uncontrolled     Past Surgical History:  Procedure Laterality Date  . CORONARY ARTERY BYPASS GRAFT    . IR URETERAL STENT RIGHT NEW ACCESS W/O SEP NEPHROSTOMY CATH  04/07/2017  . LEFT HEART CATHETERIZATION WITH CORONARY ANGIOGRAM N/A 06/17/2014   Procedure: LEFT HEART CATHETERIZATION WITH CORONARY ANGIOGRAM;  Surgeon: Laverda Page, MD;  Location: [facility] CATH LAB;  Service:  Cardiovascular;  Laterality: N/A;  . NEPHROLITHOTOMY Right 04/07/2017   Procedure: NEPHROLITHOTOMY PERCUTANEOUS RIGHT;  Surgeon: Kathie Rhodes, MD;  Location: [facility] ORS;  Service: Urology;  Laterality: Right;    Social History   Socioeconomic History  . Marital status: Legally Separated    Spouse name: Not on file  . Number of children: Not on file  . Years of education: Not on file  . Highest education level: Not on file  Occupational History  . Not on file  Social Needs  . Financial resource strain: Not on file  . Food insecurity    Worry: Not on file    Inability: Not on file  . Transportation needs    Medical: Not on file    Non-medical: Not on file  Tobacco Use  . Smoking status: Never Smoker  . Smokeless tobacco: Current User    Types: Chew  . Tobacco comment: Advised to quit.  Substance and Sexual Activity  . Alcohol use: Yes    Alcohol/week: 0.0 - 1.0 standard drinks    Comment: rare  . Drug use: No  . Sexual activity: Not on file  Lifestyle  . Physical activity    Days per week: Not on file    Minutes per session: Not on file  . Stress: Not on file  Relationships  . Social Herbalist on phone: Not on file    Gets together: Not on file    Attends religious service: Not on file    Active member of club or organization: Not on file    Attends meetings of clubs  or organizations: Not on file    Relationship status: Not on file  . Intimate partner violence    Fear of current or ex partner: Not on file    Emotionally abused: Not on file    Physically abused: Not on file    Forced sexual activity: Not on file  Other Topics Concern  . Not on file  Social History Narrative  . Not on file   ***ROS Objective  There were no vitals taken for this visit. There is no height or weight on file to calculate BMI.   ***Physical Exam Radiology: No results found.  Laboratory examination:     02/07/2018: Creatinine 1.1, EGFR 65/75, potassium 4.6, calcium  10.7, CMP normal. Cholesterol 126, triglycerides 110, HDL 36, LDL 68.  CMP Latest Ref Rng & Units 04/14/2017 04/07/2017 04/04/2017  Glucose 65 - 99 mg/dL 283(H) 329(H) 340(H)  BUN 6 - 20 mg/dL _0 Creatinine 0.61 - 1.24 mg/dL 1.39(H) 1.04 0.92  Sodium 135 - 145 mmol/L 136 136 136  Potassium 3.5 - 5.1 mmol/L 3.7 4.3 4.8  Chloride 101 - 111 mmol/L 97(L) 100(L) 98(L)  CO2 22 - 32 mmol/L _1 Calcium 8.9 - 10.3 mg/dL 10.2 9.8 9.8  Total Protein 6.0 - 8.3 g/dL - - -  Total Bilirubin 0.3 - 1.2 mg/dL - - -  Alkaline Phos 39 - 117 U/L - - -  AST 0 - 37 U/L - - -  ALT 0 - 53 U/L - - -   CBC Latest Ref Rng & Units 04/14/2017 04/07/2017 04/04/2017  WBC 4.0 - 10.5 K/uL 11.2(H) 7.6 7.6  Hemoglobin 13.0 - 17.0 g/dL 13.5 15.5 15.2  Hematocrit 39.0 - 52.0 % 39.0 46.2 45.0  Platelets 150 - 400 K/uL 320 214 207   Lipid Panel  No results found for: CHOL, TRIG, HDL, CHOLHDL, VLDL, LDLCALC, LDLDIRECT HEMOGLOBIN A1C Lab Results  Component Value Date   HGBA1C 9.9 (H) 04/04/2017   MPG 237 04/04/2017   TSH No results for input(s): TSH in the last 8760 hours.  Medications   Current Outpatient Medications  Medication Instructions  . apixaban (ELIQUIS) 5 mg, Oral, 2 times daily  . Dulaglutide (TRULICITY) 1.5 VE/9.3YB SOPN 0.5 mLs, Subcutaneous, Every Wed  . lisinopril (ZESTRIL) 5 mg, Oral, Daily after breakfast  . metFORMIN (GLUCOPHAGE) 1,000 mg, Oral, 2 times daily with meals  . metoprolol tartrate (LOPRESSOR) 50 mg, Oral, 2 times daily  . Polyethyl Glycol-Propyl Glycol (SYSTANE) 0.4-0.3 % SOLN 2-3 drops, Both Eyes, Daily  . rosuvastatin (CRESTOR) 20 mg, Oral, Every evening    Cardiac Studies:   Coronary Angiogram [06/17/2014]:  LIMA to LAD patent, SVG to PDA, SVG to OM1, SVG to ramus occluded.. Severe native vessel disease. Left main 95% stenosis. Medical therapy.  Echocardiogram [05/01/2014]:  1. Left ventricle cavity is normal in size. Moderate concentric hypertrophy of the left  ventricle. Mild decrease in global wall motion. Mildly depressed systolic function, calculated EF- 50% 2. Left atrial cavity is mildly dilated. 3. Mild tricuspid regurgitation. No evidence of pulmonary hypertension  Nuclear stress test [04/07/2014]:  1. The resting electrocardiogram demonstrated normal sinus rhythm. Inferior infarct, old. Anterior infarct, old. Stress EKG was non diagnostic due to pharmacologic stress. The patient did develop dyspnea and leg pain. The stress test was terminated because of the end of the pharmacological stress. 2. SPECT images demonstrate Medium perfusion abnormality of severe intensity in the basal inferior, basal inferolateral, mid inferoseptal, mid inferior, mid  inferolateral and lateral myocardial wall(s) on the stress images. The defect is not present on the resting images consistent with ischemia. The left ventricular ejection fraction was calculated or visually estimated to be 45%. This is an abnormal stress, consider further work up if clinically indicated. This is at least an intermediate stress test.  Assessment  Encounter for care of pacemaker  Cardiac pacemaker in situ  Paroxysmal atrial fibrillation (HCC) -  CHA2DS2-VASc Score is 4 with yearly risk of stroke of 4%  Coronary artery disease involving native coronary artery of native heart without angina pectoris  Hx of CABG - CABG 10/13/10: LIMA to LAD, SVG to Ramus I and Ramus II, SVG to OM, SVG to PDA Dr Ceasar Mons  EKG 08/01/2018: A paced rhythm with first-degree AV block, inferior infarct old, anteroseptal infarct old, borderline criteria for LVH. PVC. Nonspecific T abnormality. No significant change from EKG 07/27/2017  Remote Pacemaker 12/27/2018: Patient is pacer dependent. Battery longevity is 3.5-7.5 years. RA pacing is 99.3 %, RV pacing is 0.9 %.  Recommendations:   ***  Adrian Prows, MD, [facility] 05/08/2019, 7:31 AM [facility]. [place] Pager: [phone] Office: [phone] If  no answer Cell [phone]

## 2019-05-11 ENCOUNTER — Encounter: Payer: Self-pay | Admitting: Cardiology

## 2019-05-11 DIAGNOSIS — I495 Sick sinus syndrome: Secondary | ICD-10-CM

## 2019-05-11 DIAGNOSIS — Z45018 Encounter for adjustment and management of other part of cardiac pacemaker: Secondary | ICD-10-CM

## 2019-05-11 HISTORY — DX: Encounter for adjustment and management of other part of cardiac pacemaker: Z45.018

## 2019-05-11 HISTORY — DX: Sick sinus syndrome: I49.5

## 2019-05-13 ENCOUNTER — Telehealth: Payer: Self-pay

## 2019-05-13 NOTE — Telephone Encounter (Signed)
Pt aware will set up [place] clinic check

## 2019-05-13 NOTE — Telephone Encounter (Signed)
-----   Message from Adrian Prows, MD sent at 05/11/2019  3:03 PM EDT ----- Regarding: Pacemaker He also needs clinic pacemaker check. He had an appointment yesterday with me, but did not show up. He is very compliant.

## 2019-07-08 ENCOUNTER — Encounter: Payer: Self-pay | Admitting: Cardiology

## 2019-07-08 ENCOUNTER — Other Ambulatory Visit: Payer: Self-pay

## 2019-07-08 ENCOUNTER — Ambulatory Visit (INDEPENDENT_AMBULATORY_CARE_PROVIDER_SITE_OTHER): Payer: Medicare Other | Admitting: Cardiology

## 2019-07-08 VITALS — BP 120/88 | HR 67 | Ht 66.0 in | Wt 179.0 lb

## 2019-07-08 DIAGNOSIS — M545 Low back pain: Secondary | ICD-10-CM

## 2019-07-08 DIAGNOSIS — I480 Paroxysmal atrial fibrillation: Secondary | ICD-10-CM

## 2019-07-08 DIAGNOSIS — Z950 Presence of cardiac pacemaker: Secondary | ICD-10-CM

## 2019-07-08 DIAGNOSIS — I495 Sick sinus syndrome: Secondary | ICD-10-CM

## 2019-07-08 DIAGNOSIS — I25810 Atherosclerosis of coronary artery bypass graft(s) without angina pectoris: Secondary | ICD-10-CM | POA: Diagnosis not present

## 2019-07-08 DIAGNOSIS — E119 Type 2 diabetes mellitus without complications: Secondary | ICD-10-CM

## 2019-07-08 DIAGNOSIS — G8929 Other chronic pain: Secondary | ICD-10-CM

## 2019-07-08 DIAGNOSIS — Z951 Presence of aortocoronary bypass graft: Secondary | ICD-10-CM

## 2019-07-08 DIAGNOSIS — Z794 Long term (current) use of insulin: Secondary | ICD-10-CM

## 2019-07-08 DIAGNOSIS — I2510 Atherosclerotic heart disease of native coronary artery without angina pectoris: Secondary | ICD-10-CM

## 2019-07-08 MED ORDER — OXYCODONE-ACETAMINOPHEN 2.5-325 MG PO TABS: 1.0000 | ORAL_TABLET | Freq: Two times a day (BID) | ORAL | 0 refills | Status: DC | PRN

## 2019-07-08 NOTE — Progress Notes (Signed)
Primary Physician/Referring:  Patient, [name]  Patient ID: Roger Young, male    DOB: 1941/07/09, 78 y.o.   MRN: [id number]  Chief Complaint  Patient presents with  . Coronary Artery Disease    6 month f/u   HPI:    Roger Young  is a 78 y.o. 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 Coronary angiogram performed on 06/17/2014 showed "severe native vessel disease with no lesion amenable to PCI". 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 He is presently on long-term anticoagulation but plans to stop after he runs out of his Eliquis. He had hematuria diagnosed with staghorn calculus and underwent extraction in June 2018. No further hematurea.  He has multiple complaints today including severe back pain, continued constipation and occasional blood streaked stool and hemorrhoids, has stopped taking his diabetes medications, only taking metformin.  He also requests prescription for Percocet.  He denies any symptoms of claudication, recent weight changes, TIA. He has mild chronic dyspnea no PND or orthopnea, chest pain.   Past Medical History:  Diagnosis Date  . Back pain    lower back with pain shooting down right leg  . CHF (congestive heart failure) ([place])   . Chronic ischemic heart disease, unspecified   . Encounter for care of pacemaker 05/11/2019  . Essential hypertension, benign   . History of kidney stones   . Other heart block   . Other testicular hypofunction   . Sinus node dysfunction ([place]) 05/11/2019  . Type II or unspecified type diabetes mellitus without mention of complication, not stated as uncontrolled    Past Surgical History:  Procedure Laterality Date  . CORONARY ARTERY BYPASS GRAFT    . IR URETERAL STENT RIGHT NEW ACCESS W/O SEP  NEPHROSTOMY CATH  04/07/2017  . LEFT HEART CATHETERIZATION WITH CORONARY ANGIOGRAM N/A 06/17/2014   Procedure: LEFT HEART CATHETERIZATION WITH CORONARY ANGIOGRAM;  Surgeon: Laverda Page, MD;  Location: [facility] CATH LAB;  Service: Cardiovascular;  Laterality: N/A;  . NEPHROLITHOTOMY Right 04/07/2017   Procedure: NEPHROLITHOTOMY PERCUTANEOUS RIGHT;  Surgeon: Kathie Rhodes, MD;  Location: [facility] ORS;  Service: Urology;  Laterality: Right;   Social History   Socioeconomic History  . Marital status: Legally Separated    Spouse name: Not on file  . Number of children: Not on file  . Years of education: Not on file  . Highest education level: Not on file  Occupational History  . Not on file  Social Needs  . Financial resource strain: Not on file  . Food insecurity    Worry: Not on file    Inability: Not on file  . Transportation needs    Medical: Not on file    Non-medical: Not on file  Tobacco Use  . Smoking status: Never Smoker  . Smokeless tobacco: Current User    Types: Chew  . Tobacco comment: Advised to quit.  Substance and Sexual Activity  . Alcohol use: Yes    Alcohol/week: 0.0 - 1.0 standard drinks    Comment: rare  . Drug use: No  . Sexual activity: Not on file  Lifestyle  . Physical activity    Days per week: Not on file    Minutes per session: Not on file  . Stress: Not on file  Relationships  . Social connections    Talks  on phone: Not on file    Gets together: Not on file    Attends religious service: Not on file    Active member of club or organization: Not on file    Attends meetings of clubs or organizations: Not on file    Relationship status: Not on file  . Intimate partner violence    Fear of current or ex partner: Not on file    Emotionally abused: Not on file    Physically abused: Not on file    Forced sexual activity: Not on file  Other Topics Concern  . Not on file  Social History Narrative  . Not on file   ROS  Review of Systems  Constitution:  Negative for chills, decreased appetite, malaise/fatigue and weight gain.  Cardiovascular: Negative for dyspnea on exertion, leg swelling and syncope.  Endocrine: Negative for cold intolerance.  Hematologic/Lymphatic: Does not bruise/bleed easily.  Musculoskeletal: Negative for joint swelling.  Gastrointestinal: Positive for change in bowel habit, constipation and hemorrhoids. Negative for abdominal pain, anorexia, hematochezia and melena.  Genitourinary: Positive for decreased libido.  Neurological: Negative for headaches and light-headedness.  Psychiatric/Behavioral: Negative for depression and substance abuse.  All other systems reviewed and are negative.  Objective   Vitals with BMI 07/08/2019 04/14/2017 04/14/2017  Height 5' 6"  - -  Weight 179 lbs - -  BMI 14.43 - -  Systolic 154 008 676  Diastolic 88 73 72  Pulse 67 62 60    Blood pressure 120/88, pulse 67, height 5' 6"  (1.676 m), weight 179 lb (81.2 kg), SpO2 99 %. Body mass index is 28.89 kg/m.   Physical Exam  Constitutional: He appears well-developed and well-nourished. No distress.  HENT:  Head: Atraumatic.  Eyes: Conjunctivae are normal.  Neck: Neck supple. No JVD present. No thyromegaly present.  Cardiovascular: Normal rate, regular rhythm, normal heart sounds and intact distal pulses. Exam reveals no gallop.  No murmur heard. No edema, no JVD.   Pulmonary/Chest: Effort normal and breath sounds normal.  Pacemaker/ICD site noted  in the left infraclavicular fossa.    Abdominal: Soft. Bowel sounds are normal.  Musculoskeletal: Normal range of motion.  Neurological: He is alert.  Skin: Skin is warm and dry.  Psychiatric: He has a normal mood and affect.   Radiology: No results found.  Laboratory examination:   02/07/2018: Creatinine 1.1, EGFR 65/75, potassium 4.6, calcium 10.7, CMP normal. Cholesterol 126, triglycerides 110, HDL 36, LDL 68.  No results for input(s): NA, K, CL, CO2, GLUCOSE, BUN, CREATININE,  CALCIUM, GFRNONAA, GFRAA in the last 8760 hours. CMP Latest Ref Rng & Units 04/14/2017 04/07/2017 04/04/2017  Glucose 65 - 99 mg/dL 283(H) 329(H) 340(H)  BUN 6 - 20 mg/dL 20 20 15   Creatinine 0.61 - 1.24 mg/dL 1.39(H) 1.04 0.92  Sodium 135 - 145 mmol/L 136 136 136  Potassium 3.5 - 5.1 mmol/L 3.7 4.3 4.8  Chloride 101 - 111 mmol/L 97(L) 100(L) 98(L)  CO2 22 - 32 mmol/L 29 25 27   Calcium 8.9 - 10.3 mg/dL 10.2 9.8 9.8  Total Protein 6.0 - 8.3 g/dL - - -  Total Bilirubin 0.3 - 1.2 mg/dL - - -  Alkaline Phos 39 - 117 U/L - - -  AST 0 - 37 U/L - - -  ALT 0 - 53 U/L - - -   CBC Latest Ref Rng & Units 04/14/2017 04/07/2017 04/04/2017  WBC 4.0 - 10.5 K/uL 11.2(H) 7.6 7.6  Hemoglobin 13.0 - 17.0 g/dL 13.5 15.5  15.2  Hematocrit 39.0 - 52.0 % 39.0 46.2 45.0  Platelets 150 - 400 K/uL 320 214 207   Lipid Panel  No results found for: CHOL, TRIG, HDL, CHOLHDL, VLDL, LDLCALC, LDLDIRECT HEMOGLOBIN A1C Lab Results  Component Value Date   HGBA1C 9.9 (H) 04/04/2017   MPG 237 04/04/2017   TSH No results for input(s): TSH in the last 8760 hours. Medications   Prior to Admission medications   Medication Sig Start Date End Date Taking? Authorizing Provider  aspirin EC 81 MG tablet Take 81 mg by mouth daily.   Yes [provider]  metFORMIN (GLUCOPHAGE) 1000 MG tablet Take 1 tablet (1,000 mg total) by mouth 2 (two) times daily with a meal. 06/19/14  Yes Adrian Prows, MD  apixaban (ELIQUIS) 5 MG TABS tablet Take 5 mg by mouth 2 (two) times daily.    [provider]  Dulaglutide (TRULICITY) 1.5 DG/6.4QI SOPN Inject 0.5 mLs into the skin every Wednesday.     [provider]  lisinopril (PRINIVIL,ZESTRIL) 5 MG tablet Take 5 mg by mouth daily after breakfast.     [provider]  metoprolol (LOPRESSOR) 50 MG tablet Take 50 mg by mouth 2 (two) times daily.    [provider]  Polyethyl Glycol-Propyl Glycol (SYSTANE) 0.4-0.3 % SOLN Place 2-3 drops into both eyes daily.     [provider]  rosuvastatin (CRESTOR) 20 MG tablet Take 20 mg by mouth every evening.     [provider]     Current Outpatient Medications  Medication Instructions  . apixaban (ELIQUIS) 5 mg, Oral, 2 times daily  . aspirin EC 81 mg, Oral, Daily  . Dulaglutide (TRULICITY) 1.5 HK/7.4QV SOPN 0.5 mLs, Subcutaneous, Every Wed  . lisinopril (ZESTRIL) 5 mg, Oral, Daily after breakfast  . metFORMIN (GLUCOPHAGE) 1,000 mg, Oral, 2 times daily with meals  . metoprolol tartrate (LOPRESSOR) 50 mg, Oral, 2 times daily  . oxycodone-acetaminophen (PERCOCET) 2.5-325 MG tablet 1 tablet, Oral, 2 times daily PRN  . Polyethyl Glycol-Propyl Glycol (SYSTANE) 0.4-0.3 % SOLN 2-3 drops, Both Eyes, Daily  . rosuvastatin (CRESTOR) 20 mg, Oral, Every evening    Cardiac Studies:   CABG 10/13/10: LIMA to LAD, SVG to Ramus I and Ramus II, SVG to OM, SVG to PDA Dr Ceasar Mons. On beta blockers chronically  Coronary angiogram 06/17/2014: LIMA to LAD patent, SVG to PDA, SVG to OM1, SVG to ramus occluded.. Severe native vessel disease. Left main 95% stenosis. Medical therapy.  Lexiscan Myoview stress test 04/07/2014: 1. The resting electrocardiogram demonstrated normal sinus rhythm. Inferior infarct, old. Anterior infarct, old. Stress EKG was non diagnostic due to pharmacologic stress. The patient did develop dyspnea and leg pain. The stress test was terminated because of the end of the pharmacological stress. 2. SPECT images demonstrate Medium perfusion abnormality of severe intensity in the basal inferior, basal inferolateral, mid inferoseptal, mid inferior, mid inferolateral and lateral myocardial wall(s) on the stress images. The defect is not present on the resting images consistent with ischemia. The left ventricular ejection fraction was calculated or visually estimated to be 45%. This is an abnormal stress, consider further work up if clinically indicated. This is at least an intermediate stress  test.  Echo- 05/01/14: Left ventricle cavity is normal in size. Moderate concentric hypertrophy of the left ventricle. Mild decrease in global wall motion. Mildly depressed systolic function, calculated EF- 50%. Left atrial cavity is mildly dilated. Mild tricuspid regurgitation. No evidence of pulmonary hypertension   Assessment  ICD-10-CM   1. Pacemaker Med[employer]onic ADRL1  Z95.0   2. Coronary artery disease involving native coronary artery of native heart without angina pectoris  I25.10 LDL cholesterol, direct    Lipid Panel With LDL/HDL Ratio  3. Hx of CABG  Z95.1    10/13/10: LIMA to LAD, SVG to Ramus I and Ramus II, SVG to OM, SVG to PDA  4. Coronary artery disease involving coronary bypass graft of native heart without angina pectoris  I25.810   5. Sinus node dysfunction (HCC)  I49.5   6. Paroxysmal atrial fibrillation (HCC)  I48.0 CBC  7. Type 2 diabetes mellitus without complication, with long-term current use of insulin (HCC)  E11.9 TSH   Z79.4 CMP14+EGFR    Hgb A1c w/o eAG  8. Chronic midline low back pain without sciatica  M54.5 oxycodone-acetaminophen (PERCOCET) 2.5-325 MG tablet   G89.29    EKG 08/01/2018: A paced rhythm with first-degree AV block, inferior infarct old, anteroseptal infarct old, borderline criteria for LVH. PVC. Nonspecific T abnormality. No significant change from EKG 07/27/2017   Remote pacemaker check 05/07/2019: Patient is pacer dependent. No mode switches. Battery longevity is 3-7 years. RA pacing is 99.4 %, RV pacing is 0.7 %.  Recommendations:   Patient with known coronary artery disease without angina pectoris, hypertension, uncontrolled diabetes mellitus, states that he has discontinued all his diabetes medications, has not seen his PCP, also complains of back pain, constipation, episodes of blood streaked stool and occasional hemorrhoidal bleed, presents here for 18-monthoffice visit.  Multiple complaints, advised him that he should certainly see his  PCP as his diabetes was previously uncontrolled.  I did take the liberty of ordering the labs, I will forward a copy to Dr. LRex Kras  With regard to constipation and hemorrhoids, I request Dr. PCarol Adato set up an appointment to see him.  I do not have time to evaluate his pacemaker today, I will set this up on another day, trying to address his multiple medical issues was a major deal today and also trying to impress upon him regarding compliance with medical follow-up and medications.  I will see him back in 1 month.  I have given him 30 tablets of Percocet 2.5/325 mg without refills for back pain and further refills he is with his PCP or pain management.  Fortunately no clinical evidence of heart failure, no recurrence of angina pectoris.  "Total time spent with patient was  40 minutes and greater than 50% of that time was spent in face to face discussion, counseling and coordination care"   JAdrian Prows MD, F[facility]9/14/2020, 1:45 PM [facility]ville[facility]. [place]ager: [phone] Office: [phone] no answer Cell [phone]

## 2019-07-30 ENCOUNTER — Telehealth: Payer: Self-pay

## 2019-07-30 NOTE — Telephone Encounter (Signed)
LMOM with details

## 2019-07-30 NOTE — Telephone Encounter (Signed)
-----   Message from Adrian Prows, MD sent at 07/30/2019  6:02 AM EDT ----- Regarding: Pcaemaker Normal function

## 2019-08-06 DIAGNOSIS — I4891 Unspecified atrial fibrillation: Secondary | ICD-10-CM

## 2019-08-06 DIAGNOSIS — Z950 Presence of cardiac pacemaker: Secondary | ICD-10-CM | POA: Diagnosis not present

## 2019-08-06 DIAGNOSIS — I440 Atrioventricular block, first degree: Secondary | ICD-10-CM

## 2019-08-06 DIAGNOSIS — Z45018 Encounter for adjustment and management of other part of cardiac pacemaker: Secondary | ICD-10-CM | POA: Diagnosis not present

## 2019-08-08 ENCOUNTER — Ambulatory Visit: Payer: Medicare Other | Admitting: Cardiology

## 2019-08-08 DIAGNOSIS — Z5329 Procedure and treatment not carried out because of patient's decision for other reasons: Secondary | ICD-10-CM

## 2019-11-05 DIAGNOSIS — Z4509 Encounter for adjustment and management of other cardiac device: Secondary | ICD-10-CM

## 2019-11-05 DIAGNOSIS — I441 Atrioventricular block, second degree: Secondary | ICD-10-CM

## 2019-11-05 DIAGNOSIS — Z95818 Presence of other cardiac implants and grafts: Secondary | ICD-10-CM

## 2019-11-11 ENCOUNTER — Telehealth: Payer: Self-pay

## 2019-11-11 NOTE — Telephone Encounter (Signed)
-----   Message from Yates Decamp, MD sent at 11/09/2019  6:36 PM EST ----- Regarding: Pacemaker Remote pacemaker check 11/04/2019:  There were 3 atrial high rate episodes detected. The longest lasted 00:00:00;30 in duration. There was a .1 % cumulative atrial arrhythmia burden. There were 0 high ventricular rate episodes detected. Health trends do not demonstrate significant abnormality. Battery longevity is 5 years. RA pacing is 99.1 %, RV pacing is 1.2 %.  Normal function.   JG

## 2019-11-11 NOTE — Telephone Encounter (Signed)
Pt aware.

## 2020-04-07 ENCOUNTER — Telehealth: Payer: Self-pay | Admitting: Cardiology

## 2020-04-08 NOTE — Telephone Encounter (Signed)
Called and spoke with patient regarding his pacemaker check results.

## 2020-07-26 ENCOUNTER — Telehealth: Payer: Self-pay | Admitting: Cardiology

## 2020-07-26 DIAGNOSIS — T82110A Breakdown (mechanical) of cardiac electrode, initial encounter: Secondary | ICD-10-CM

## 2020-07-26 DIAGNOSIS — Z950 Presence of cardiac pacemaker: Secondary | ICD-10-CM

## 2020-07-26 NOTE — Telephone Encounter (Signed)
Remote pacemaker check 07/15/2020:  There were 5 automatic mode switch episodes detected. The longest lasted 1 minute in duration. No EGMs available for review. There was a <0.1 % cumulative atrial arrhythmia burden. There was 1 high ventricular rate episode detected. On 02/17/20 (16:42), there was a 7 beat episode of NSVT (previously documented). Battery longevity is 3.5 years. RA pacing is 98.8 %, RV pacing is 1.9 %.  Alert: ventricular threshold is 2.5 V since March 2021. Ventricular impedance is also increasing, currently 1469 ohms.    ICD-10-CM   1. Cardiac pacemaker in situ  Z95.0 DG Chest 2 View  2. Failure of pacemaker lead, initial encounter  T82.110A DG Chest 2 View   Will schedule OV and in-person check. Not RV dependant.   Yates Decamp, MD, [facility] 07/26/2020, 1:14 PM Office: [phone]

## 2020-07-27 ENCOUNTER — Telehealth: Payer: Self-pay

## 2020-07-27 NOTE — Telephone Encounter (Signed)
Patient was called, patient is having phone issues.   I advised him to go to [facility] [facility] for a chest x ray. Address was given to patient. Order has been faxed to [facility]. No auth needed for this order from MCR/ [employer]. Patient needs to sch an ov with pacercheck. Patient aware. Awaiting patients call back to sch.   Cleotis Nipper

## 2020-07-28 NOTE — Telephone Encounter (Signed)
When our office tried to reach him, patient stated that he does not want to make appointment and had canceled his appointment.  I discussed with his son Roger Young as I was not able to get in touch with the patient himself and explained that his V lead is probably fractured and not working properly and it is important to keep the appointment.  His son acknowledged and will keep the appointment for now.  I also left a message on Mr. Roger Young's cell phone.

## 2020-07-29 ENCOUNTER — Ambulatory Visit
Admission: RE | Admit: 2020-07-29 | Discharge: 2020-07-29 | Disposition: A | Discharge status: Home / Self Care | Payer: Medicare Other | Source: Ambulatory Visit | Attending: Cardiology | Admitting: Cardiology

## 2020-07-29 DIAGNOSIS — T82110A Breakdown (mechanical) of cardiac electrode, initial encounter: Secondary | ICD-10-CM

## 2020-07-29 DIAGNOSIS — Z950 Presence of cardiac pacemaker: Secondary | ICD-10-CM

## 2020-07-29 NOTE — Progress Notes (Signed)
Chest x-ray PA and lateral view on 07/29/2020, comparison 08/14/2015: Dual-chamber pacemaker leads in stable position.  No lead fracture or disconnect noted.   Otherwise no acute process.  I personally reviewed the images.

## 2020-07-30 ENCOUNTER — Encounter: Payer: Self-pay | Admitting: Cardiology

## 2020-07-30 ENCOUNTER — Ambulatory Visit: Payer: Medicare Other | Admitting: Cardiology

## 2020-07-30 ENCOUNTER — Other Ambulatory Visit: Payer: Self-pay

## 2020-07-30 VITALS — BP 160/88 | HR 68 | Resp 16 | Ht 66.0 in | Wt 177.6 lb

## 2020-07-30 DIAGNOSIS — I10 Essential (primary) hypertension: Secondary | ICD-10-CM

## 2020-07-30 DIAGNOSIS — I495 Sick sinus syndrome: Secondary | ICD-10-CM

## 2020-07-30 DIAGNOSIS — G301 Alzheimer's disease with late onset: Secondary | ICD-10-CM

## 2020-07-30 DIAGNOSIS — F0280 Dementia in other diseases classified elsewhere without behavioral disturbance: Secondary | ICD-10-CM

## 2020-07-30 DIAGNOSIS — Z950 Presence of cardiac pacemaker: Secondary | ICD-10-CM

## 2020-07-30 DIAGNOSIS — E7800 Pure hypercholesterolemia, unspecified: Secondary | ICD-10-CM

## 2020-07-30 DIAGNOSIS — Z45018 Encounter for adjustment and management of other part of cardiac pacemaker: Secondary | ICD-10-CM

## 2020-07-30 DIAGNOSIS — E119 Type 2 diabetes mellitus without complications: Secondary | ICD-10-CM

## 2020-07-30 DIAGNOSIS — I2510 Atherosclerotic heart disease of native coronary artery without angina pectoris: Secondary | ICD-10-CM

## 2020-07-30 MED ORDER — ATORVASTATIN CALCIUM 40 MG PO TABS: 40.0000 mg | ORAL_TABLET | Freq: Every day | ORAL | 3 refills | Status: DC

## 2020-07-30 MED ORDER — ASPIRIN 81 MG PO CHEW: 81.0000 mg | CHEWABLE_TABLET | Freq: Every day | ORAL | 3 refills | Status: AC

## 2020-07-30 MED ORDER — METOPROLOL SUCCINATE ER 50 MG PO TB24: 50.0000 mg | ORAL_TABLET | Freq: Every day | ORAL | 3 refills | Status: DC

## 2020-07-30 MED ORDER — LISINOPRIL 10 MG PO TABS: 10.0000 mg | ORAL_TABLET | Freq: Every day | ORAL | 2 refills | Status: DC

## 2020-07-30 NOTE — Progress Notes (Signed)
Primary Physician/Referring:  Guadalupe Maple, MD  Patient ID: Roger Young, male    DOB: 1941-08-30, 79 y.o.   MRN: [id number]  Chief Complaint  Patient presents with  . Hypertension  . Pacemaker Check    Medtronic  . Follow-up    1 year   HPI:    Roger Young  is a 79 y.o. 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 Coronary angiogram performed on 06/17/2014 showed "severe native vessel disease with no lesion amenable to PCI". ASCVD risk factors include: diabetes, hyperlipidemia and hypertension. 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 He is presently was on long-term anticoagulation and stopped it per patient preference for the past on year.   I had seen him a year ago in September 2020, recent pacemaker transmission revealed markedly elevated impedance on the RV lead.  Also as it was overdue for his visit, I brought him in.  States that he is presently doing well but has discontinued metoprolol and has also been off of Eliquis for the past almost a year.  States that he is tolerating dementia and forgetfulness.  His son is present.  No chest pain, dizziness or palpitations   Past Medical History:  Diagnosis Date  . Back pain    lower back with pain shooting down right leg  . CHF (congestive heart failure) ([place])   . Chronic ischemic heart disease, unspecified   . Encounter for care of pacemaker 05/11/2019  . Essential hypertension, benign   . History of kidney stones   . Other heart block   . Other testicular hypofunction   . Sinus node dysfunction ([place]) 05/11/2019  . Type II or unspecified type diabetes mellitus without mention of complication, not stated as uncontrolled    Past Surgical History:  Procedure Laterality Date  . CORONARY ARTERY BYPASS GRAFT    . IR URETERAL STENT RIGHT NEW ACCESS W/O SEP  NEPHROSTOMY CATH  04/07/2017  . LEFT HEART CATHETERIZATION WITH CORONARY ANGIOGRAM N/A 06/17/2014   Procedure: LEFT HEART CATHETERIZATION WITH CORONARY ANGIOGRAM;  Surgeon: Laverda Page, MD;  Location: [facility] CATH LAB;  Service: Cardiovascular;  Laterality: N/A;  . NEPHROLITHOTOMY Right 04/07/2017   Procedure: NEPHROLITHOTOMY PERCUTANEOUS RIGHT;  Surgeon: Kathie Rhodes, MD;  Location: [facility] ORS;  Service: Urology;  Laterality: Right;   Social History   Tobacco Use  . Smoking status: Never Smoker  . Smokeless tobacco: Current User    Types: Chew  . Tobacco comment: Advised to quit.  Substance Use Topics  . Alcohol use: Yes    Alcohol/week: 0.0 - 1.0 standard drinks    Comment: rare  Marital Status: Legally Separated   ROS  Review of Systems  Cardiovascular: Negative for chest pain, dyspnea on exertion and leg swelling.  Gastrointestinal: Negative for melena.  Psychiatric/Behavioral: Positive for memory loss.   Objective   Vitals with BMI 07/30/2020 07/30/2020 07/08/2019  Height - 5' 6" 5' 6"  Weight - 177 lbs 10 oz 179 lbs  BMI - 04.59 97.74  Systolic 142 395 320  Diastolic 88 90 88  Pulse - 68 67    Blood pressure (!) 160/88, pulse 68, resp. rate 16, height 5' 6" (1.676 m), weight 177 lb 9.6 oz (80.6 kg), SpO2 99 %. Body mass index is 28.67 kg/m.   Physical Exam Constitutional:      General: He is not  in acute distress.    Appearance: He is well-developed.  HENT:     Head: Atraumatic.  Eyes:     Conjunctiva/sclera: Conjunctivae normal.  Neck:     Thyroid: No thyromegaly.     Vascular: No JVD.  Cardiovascular:     Rate and Rhythm: Normal rate and regular rhythm.     Pulses: Intact distal pulses.     Heart sounds: Normal heart sounds. No murmur heard.  No gallop.      Comments: No edema, no JVD.  Pulmonary:     Effort: Pulmonary effort is normal.     Breath sounds: Normal breath sounds.  Abdominal:     General: Bowel sounds are normal.     Palpations: Abdomen is  soft.  Musculoskeletal:        General: Normal range of motion.     Cervical back: Neck supple.  Skin:    General: Skin is warm and dry.  Neurological:     Mental Status: He is alert.    Radiology: DG Chest 2 View  Result Date: 07/29/2020 CLINICAL DATA:  Cardiac pacer EXAM: CHEST - 2 VIEW COMPARISON:  08/14/2015 FINDINGS: Dual-chamber pacer leads from the left in stable position. Borderline heart size. CABG. There is no edema, consolidation, effusion, or pneumothorax. Spondylosis. IMPRESSION: No evidence of acute disease. Electronically Signed   By: Monte Fantasia M.D.   On: 07/29/2020 10:58    Laboratory examination:   No results for input(s): NA, K, CL, CO2, GLUCOSE, BUN, CREATININE, CALCIUM, GFRNONAA, GFRAA in the last 8760 hours. CMP Latest Ref Rng & Units 04/14/2017 04/07/2017 04/04/2017  Glucose 65 - 99 mg/dL 283(H) 329(H) 340(H)  BUN 6 - 20 mg/dL _0 Creatinine 0.61 - 1.24 mg/dL 1.39(H) 1.04 0.92  Sodium 135 - 145 mmol/L 136 136 136  Potassium 3.5 - 5.1 mmol/L 3.7 4.3 4.8  Chloride 101 - 111 mmol/L 97(L) 100(L) 98(L)  CO2 22 - 32 mmol/L _1 Calcium 8.9 - 10.3 mg/dL 10.2 9.8 9.8  Total Protein 6.0 - 8.3 g/dL - - -  Total Bilirubin 0.3 - 1.2 mg/dL - - -  Alkaline Phos 39 - 117 U/L - - -  AST 0 - 37 U/L - - -  ALT 0 - 53 U/L - - -   CBC Latest Ref Rng & Units 04/14/2017 04/07/2017 04/04/2017  WBC 4.0 - 10.5 K/uL 11.2(H) 7.6 7.6  Hemoglobin 13.0 - 17.0 g/dL 13.5 15.5 15.2  Hematocrit 39 - 52 % 39.0 46.2 45.0  Platelets 150 - 400 K/uL 320 214 207   Lipid Panel  No results found for: CHOL, TRIG, HDL, CHOLHDL, VLDL, LDLCALC, LDLDIRECT HEMOGLOBIN A1C Lab Results  Component Value Date   HGBA1C 9.9 (H) 04/04/2017   MPG 237 04/04/2017   TSH No results for input(s): TSH in the last 8760 hours.   External labs:  02/07/2018: Creatinine 1.1, EGFR 65/75, potassium 4.6, calcium 10.7, CMP normal. Cholesterol 126, triglycerides 110, HDL 36, LDL 68.  Medications    Current Outpatient Medications on File Prior to Visit  Medication Sig Dispense Refill  . metFORMIN (GLUCOPHAGE) 1000 MG tablet Take 1 tablet (1,000 mg total) by mouth 2 (two) times daily with a meal.     No current facility-administered medications on file prior to visit.    Cardiac Studies:   CABG 10/13/10: LIMA to LAD, SVG to Ramus I and Ramus II, SVG to OM, SVG to PDA Dr Ceasar Mons. On beta blockers  chronically  Coronary angiogram 06/17/2014: LIMA to LAD patent, SVG to PDA, SVG to OM1, SVG to ramus occluded.. Severe native vessel disease. Left main 95% stenosis. Medical therapy.  Lexiscan Myoview stress test 04/07/2014: 1. The resting electrocardiogram demonstrated normal sinus rhythm. Inferior infarct, old. Anterior infarct, old. Stress EKG was non diagnostic due to pharmacologic stress. The patient did develop dyspnea and leg pain. The stress test was terminated because of the end of the pharmacological stress. 2. SPECT images demonstrate Medium perfusion abnormality of severe intensity in the basal inferior, basal inferolateral, mid inferoseptal, mid inferior, mid inferolateral and lateral myocardial wall(s) on the stress images. The defect is not present on the resting images consistent with ischemia. The left ventricular ejection fraction was calculated or visually estimated to be 45%. This is an abnormal stress, consider further work up if clinically indicated. This is at least an intermediate stress test.  Echo- 05/01/14: Left ventricle cavity is normal in size. Moderate concentric hypertrophy of the left ventricle. Mild decrease in global wall motion. Mildly depressed systolic function, calculated EF- 50%. Left atrial cavity is mildly dilated. Mild tricuspid regurgitation. No evidence of pulmonary hypertension.  EKG:   Scheduled  In office pacemaker check 07/30/20  Single (S)/Dual (D)/BV: D. Presenting APVS. Pacemaker dependant:  Yes. Underlying Junctional @ 42/min. AP 99%, VP  2%. AMS Episodes 0.  HVR 1. Longest 7 beats NSVT. Latest 02/17/2020. Longevity 3.5 Years. Magnet rate: >85%. Lead measurements: Stable A lead measurements 0.5V @ 0.4 mS. V lead measurement high 5.6V at 1mS and Impedance 1569 Ohm. Bipolar.  Histogram: Low (L)/normal (N)/high (H)  Normal. Patient activity Normal.   Observations: Normal pacemaker function. Suspect lead degeneration due to age (10 years). Changes: Changed V lead polarity to Unipolar pace and Bipolar sense. MVP on.   EKG 08/01/2018: A paced rhythm with first-degree AV block, inferior infarct old, anteroseptal infarct old, borderline criteria for LVH. PVC. Nonspecific T abnormality. No significant change from EKG 07/27/2017  Assessment     ICD-10-CM   1. Encounter for care of pacemaker  Z45.018   2. Sinus node dysfunction (HCC)  I49.5   3. Coronary artery disease involving native coronary artery of native heart without angina pectoris  I25.10 aspirin (ASPIRIN CHILDRENS) 81 MG chewable tablet  4. Pacemaker Medtronic ADRL1  Z95.0   5. Hypercholesteremia  E78.00 atorvastatin (LIPITOR) 40 MG tablet    Lipid Panel With LDL/HDL Ratio  6. Primary hypertension  I10 metoprolol succinate (TOPROL-XL) 50 MG 24 hr tablet    lisinopril (ZESTRIL) 10 MG tablet    CBC    CMP14+EGFR  7. Type 2 diabetes mellitus without complication, with long-term current use of insulin (HCC)  E11.9 TSH   Z79.4 Hgb A1c w/o eAG  8. Late onset Alzheimer's dementia without behavioral disturbance (HCC)  G30.1 Ambulatory referral to Neurology   F02.80    Meds ordered this encounter  Medications  . aspirin (ASPIRIN CHILDRENS) 81 MG chewable tablet    Sig: Chew 1 tablet (81 mg total) by mouth daily.    Dispense:  90 tablet    Refill:  3  . metoprolol succinate (TOPROL-XL) 50 MG 24 hr tablet    Sig: Take 1 tablet (50 mg total) by mouth daily. Take with or immediately following a meal.    Dispense:  90 tablet    Refill:  3  . lisinopril (ZESTRIL) 10 MG tablet     Sig: Take 1 tablet (10 mg total) by mouth daily after breakfast.      Dispense:  30 tablet    Refill:  2  . atorvastatin (LIPITOR) 40 MG tablet    Sig: Take 1 tablet (40 mg total) by mouth at bedtime.    Dispense:  90 tablet    Refill:  3   Medications Discontinued During This Encounter  Medication Reason  . apixaban (ELIQUIS) 5 MG TABS tablet Discontinued by provider  . aspirin EC 81 MG tablet Discontinued by provider  . Dulaglutide (TRULICITY) 1.5 MG/0.5ML SOPN   . rosuvastatin (CRESTOR) 20 MG tablet Change in therapy  . Polyethyl Glycol-Propyl Glycol (SYSTANE) 0.4-0.3 % SOLN Patient Preference  . oxycodone-acetaminophen (PERCOCET) 2.5-325 MG tablet No longer needed (for PRN medications)  . metoprolol (LOPRESSOR) 50 MG tablet Discontinued by provider  . lisinopril (PRINIVIL,ZESTRIL) 5 MG tablet Reorder  . atorvastatin (LIPITOR) 40 MG tablet Reorder    Recommendations:   Roger Young  is a 78 y.o. 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 Coronary angiogram performed on 06/17/2014 showed "severe native vessel disease with no lesion amenable to PCI". ASCVD risk factors include: diabetes, hyperlipidemia and hypertension. 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 He is presently was on long-term anticoagulation and stopped it per patient preference for the past on year (Since 2020).   Patient is seen for annual visit and follow-up, I reviewed the results of the pacemaker interrogation, bipolar lead changed over to a unipolar lead with regard to ventricular pacing with excellent capture.  I reviewed his chest x-ray, there is no lead fracture.  Suspect old lead with degeneration in the coating of the lead. Fortunately although pacemaker dependent on the a lead, not dependent on the V lead.  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  Blood pressure is elevated, I have restarted metoprolol XL 50 mg daily and increased his lisinopril from 5 mg to 10 mg daily.  Continued atorvastatin, continue aspirin, advised him to obtain labs.  I would like to see him back in 6 weeks for follow-up.  With regard to decreased memory, neurology referral made.  We will also check A1c from diabetes standpoint as he has discontinued most of his medications.  He has now established with Dr. Clifton Baker and will certainly send a copy of my note to him.  This was a 45-minute encounter addressing multiple medical issues in addition to pacemaker check and evaluation.     , MD, FACC 07/30/2020, 5:45 PM Office: [phone]  

## 2020-07-31 LAB — LIPID PANEL WITH LDL/HDL RATIO
Cholesterol, Total: 145 mg/dL (ref 100–199)
HDL: 30 mg/dL — ABNORMAL LOW (ref >39)
LDL Chol Calc (NIH): 78 mg/dL (ref 0–99)
LDL/HDL Ratio: 2.6 ratio (ref 0.0–3.6)
Triglycerides: 218 mg/dL — ABNORMAL HIGH (ref 0–149)
VLDL Cholesterol Cal: 37 mg/dL (ref 5–40)

## 2020-07-31 LAB — CMP14+EGFR
ALT: 16 IU/L (ref 0–44)
AST: 26 IU/L (ref 0–40)
Albumin/Globulin Ratio: 1.5 (ref 1.2–2.2)
Albumin: 4.3 g/dL (ref 3.7–4.7)
Alkaline Phosphatase: 61 IU/L (ref 44–121)
BUN/Creatinine Ratio: 14 (ref 10–24)
BUN: 15 mg/dL (ref 8–27)
Bilirubin Total: 0.7 mg/dL (ref 0.0–1.2)
CO2: 23 mmol/L (ref 20–29)
Calcium: 9.8 mg/dL (ref 8.6–10.2)
Chloride: 96 mmol/L (ref 96–106)
Creatinine, Ser: 1.04 mg/dL (ref 0.76–1.27)
GFR calc Af Amer: 79 mL/min/{1.73_m2} (ref >59)
GFR calc non Af Amer: 68 mL/min/{1.73_m2} (ref >59)
Globulin, Total: 2.9 g/dL (ref 1.5–4.5)
Glucose: 178 mg/dL — ABNORMAL HIGH (ref 65–99)
Potassium: 4.6 mmol/L (ref 3.5–5.2)
Sodium: 131 mmol/L — ABNORMAL LOW (ref 134–144)
Total Protein: 7.2 g/dL (ref 6.0–8.5)

## 2020-07-31 LAB — CBC
Hematocrit: 42.6 % (ref 37.5–51.0)
Hemoglobin: 14.2 g/dL (ref 13.0–17.7)
MCH: 30.7 pg (ref 26.6–33.0)
MCHC: 33.3 g/dL (ref 31.5–35.7)
MCV: 92 fL (ref 79–97)
Platelets: 210 10*3/uL (ref 150–450)
RBC: 4.62 x10E6/uL (ref 4.14–5.80)
RDW: 12.2 % (ref 11.6–15.4)
WBC: 9.1 10*3/uL (ref 3.4–10.8)

## 2020-07-31 LAB — HGB A1C W/O EAG: Hgb A1c MFr Bld: 9.7 % — ABNORMAL HIGH (ref 4.8–5.6)

## 2020-07-31 LAB — TSH: TSH: 1.63 u[IU]/mL (ref 0.450–4.500)

## 2020-07-31 NOTE — Progress Notes (Signed)
Triglycerides elevated, probably related to his diet.  LDL is at goal.  Could improve this to less than 70.CBC is normal.Blood sugar is uncontrolled.  Kidney function and liver enzymes are normal.  Sodium level is mildly reduced at 131.Labs are stable for now, will address on his next visit in 4 to 6 weeks.

## 2020-09-11 ENCOUNTER — Ambulatory Visit: Payer: Medicare Other | Admitting: Cardiology

## 2020-10-05 ENCOUNTER — Ambulatory Visit: Payer: Medicare Other | Admitting: Cardiology

## 2020-11-05 ENCOUNTER — Telehealth: Payer: Self-pay | Admitting: Cardiology

## 2020-11-05 NOTE — Telephone Encounter (Signed)
Called and spoke with patient regarding his remote pacemaker check results.

## 2021-05-20 ENCOUNTER — Ambulatory Visit: Payer: Medicare Other | Admitting: Student

## 2021-05-20 NOTE — Progress Notes (Deleted)
Primary Physician/Referring:  Guadalupe Maple, MD  Patient ID: Roger Young, male    DOB: 1941-03-28, 80 y.o.   MRN: [id number]  No chief complaint on file.  HPI:    Roger Young  is a 80 y.o. 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 Coronary angiogram performed on 06/17/2014 showed "severe native vessel disease with no lesion amenable to PCI". ASCVD risk factors include: diabetes, hyperlipidemia and hypertension. 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 He was on long-term anticoagulation and stopped it per patient preference for the past on year.   Patient was admitted to [facility] hospital 05/11/2021 - 01/17/7123 for acute systolic heart failure with bilateral pleural effusions and new LV systolic dysfunction with LVEF of 35%.  Patient was diuresed and started on Entresto, Lasix, aspirin, Jardiance, and continued metoprolol.  During hospitalization patient developed delirium likely secondary to mild alcohol withdrawal.  Notably providers at [place] and starting spironolactone given mild rise in creatinine and deferred this to our office.  He now presents for follow up. ***  ***  I had seen him a year ago in September 2020, recent pacemaker transmission revealed markedly elevated impedance on the RV lead.  Also as it was overdue for his visit, I brought him in.  States that he is presently doing well but has discontinued metoprolol and has also been off of Eliquis for the past almost a year.  States that he is tolerating dementia and forgetfulness.  His son is present.  No chest pain, dizziness or palpitations   Past Medical History:  Diagnosis Date   Back pain    lower back with pain shooting down right leg   CHF (congestive heart failure) (HCC)    Chronic ischemic heart disease, unspecified    Encounter for  care of pacemaker 05/11/2019   Essential hypertension, benign    History of kidney stones    Other heart block    Other testicular hypofunction    Sinus node dysfunction ([place]) 05/11/2019   Type II or unspecified type diabetes mellitus without mention of complication, not stated as uncontrolled    Past Surgical History:  Procedure Laterality Date   CORONARY ARTERY BYPASS GRAFT     IR URETERAL STENT RIGHT NEW ACCESS W/O SEP NEPHROSTOMY CATH  04/07/2017   LEFT HEART CATHETERIZATION WITH CORONARY ANGIOGRAM N/A 06/17/2014   Procedure: LEFT HEART CATHETERIZATION WITH CORONARY ANGIOGRAM;  Surgeon: Laverda Page, MD;  Location: [facility] C[facility] LAB;  Service: Cardiovascular;  Laterality: N/A;   NEPHROLITHOTOMY Right 04/07/2017   Procedure: NEPHROLITHOTOMY PERCUTANEOUS RIGHT;  Surgeon: Kathie Rhodes, MD;  Location: [facility] ORS;  Service: Urology;  Laterality: Right;   Social History   Tobacco Use   Smoking status: Never   Smokeless tobacco: Current    Types: Chew   Tobacco comments:    Advised to quit.  Substance Use Topics   Alcohol use: Yes    Alcohol/week: 0.0 - 1.0 standard drinks    Comment: rare  Marital Status: Legally Separated   ROS  Review of Systems  Cardiovascular:  Negative for chest pain, dyspnea on exertion and leg swelling.  Gastrointestinal:  Negative for melena.  Psychiatric/Behavioral:  Positive for memory loss.   Objective   Vitals with BMI 07/30/2020 07/30/2020 07/08/2019  Height - $Remove'5\' 6"'eZlnuHd$  $RemoveB'5\' 6"'oEnsJrCd$   Weight - 177 lbs 10 oz 179 lbs  BMI -  73.74 96.64  Systolic 660 563 729  Diastolic 88 90 88  Pulse - 68 67    There were no vitals taken for this visit. There is no height or weight on file to calculate BMI.   Physical Exam Constitutional:      General: He is not in acute distress.    Appearance: He is well-developed.  HENT:     Head: Atraumatic.  Eyes:     Conjunctiva/sclera: Conjunctivae normal.  Neck:     Thyroid: No thyromegaly.     Vascular: No JVD.  Cardiovascular:      Rate and Rhythm: Normal rate and regular rhythm.     Pulses: Intact distal pulses.     Heart sounds: Normal heart sounds. No murmur heard.   No gallop.     Comments: No edema, no JVD.  Pulmonary:     Effort: Pulmonary effort is normal.     Breath sounds: Normal breath sounds.  Abdominal:     General: Bowel sounds are normal.     Palpations: Abdomen is soft.  Musculoskeletal:        General: Normal range of motion.     Cervical back: Neck supple.  Skin:    General: Skin is warm and dry.  Neurological:     Mental Status: He is alert.    Laboratory examination:   Recent Labs    07/30/20 1030  NA 131*  K 4.6  CL 96  CO2 23  GLUCOSE 178*  BUN 15  CREATININE 1.04  CALCIUM 9.8  GFRNONAA 68  GFRAA 79   CMP Latest Ref Rng & Units 07/30/2020 04/14/2017 04/07/2017  Glucose 65 - 99 mg/dL 178(H) 283(H) 329(H)  BUN 8 - 27 mg/dL _0 Creatinine 0.76 - 1.27 mg/dL 1.04 1.39(H) 1.04  Sodium 134 - 144 mmol/L 131(L) 136 136  Potassium 3.5 - 5.2 mmol/L 4.6 3.7 4.3  Chloride 96 - 106 mmol/L 96 97(L) 100(L)  CO2 20 - 29 mmol/L _1 Calcium 8.6 - 10.2 mg/dL 9.8 10.2 9.8  Total Protein 6.0 - 8.5 g/dL 7.2 - -  Total Bilirubin 0.0 - 1.2 mg/dL 0.7 - -  Alkaline Phos 44 - 121 IU/L 61 - -  AST 0 - 40 IU/L 26 - -  ALT 0 - 44 IU/L 16 - -   CBC Latest Ref Rng & Units 07/30/2020 04/14/2017 04/07/2017  WBC 3.4 - 10.8 x10E3/uL 9.1 11.2(H) 7.6  Hemoglobin 13.0 - 17.7 g/dL 14.2 13.5 15.5  Hematocrit 37.5 - 51.0 % 42.6 39.0 46.2  Platelets 150 - 450 x10E3/uL 210 320 214   Lipid Panel     Component Value Date/Time   CHOL 145 07/30/2020 1030   TRIG 218 (H) 07/30/2020 1030   HDL 30 (L) 07/30/2020 1030   LDLCALC 78 07/30/2020 1030   HEMOGLOBIN A1C Lab Results  Component Value Date   HGBA1C 9.7 (H) 07/30/2020   MPG 237 04/04/2017   TSH Recent Labs    07/30/20 1030  TSH 1.630      External labs:  02/07/2018: Creatinine 1.1, EGFR 65/75, potassium 4.6, calcium 10.7, CMP normal.  Cholesterol 126, triglycerides 110, HDL 36, LDL 68. Allergies  No Known Allergies    Medications Prior to Visit:   Outpatient Medications Prior to Visit  Medication Sig Dispense Refill   aspirin (ASPIRIN CHILDRENS) 81 MG chewable tablet Chew 1 tablet (81 mg total) by mouth daily. 90 tablet 3   atorvastatin (LIPITOR) 40 MG tablet  Take 1 tablet (40 mg total) by mouth at bedtime. 90 tablet 3   lisinopril (ZESTRIL) 10 MG tablet Take 1 tablet (10 mg total) by mouth daily after breakfast. 30 tablet 2   metFORMIN (GLUCOPHAGE) 1000 MG tablet Take 1 tablet (1,000 mg total) by mouth 2 (two) times daily with a meal.     metoprolol succinate (TOPROL-XL) 50 MG 24 hr tablet Take 1 tablet (50 mg total) by mouth daily. Take with or immediately following a meal. 90 tablet 3   No facility-administered medications prior to visit.     Final Medications at End of Visit    No outpatient medications have been marked as taking for the 05/20/21 encounter (Appointment) with Rayetta Pigg, Classie Weng C, PA-C.    Radiology:   No results found.  Chest x-ray 05/11/2021: 1. No convincing evidence of acute processes.  2. Left-sided pleural thickening is suspected to be result of a loculatedpleural effusion.  CT angio pulmonary 05/11/2021: No evidence of pulmonary embolism  Cardiomegaly with bilateral small pleural effusions and some interstitial septal thickening. These findings suggest congestion.  Coronary and aortic calcified plaque  Chest x-ray 05/14/2021: 1. Mild pulmonary vascular congestion.  2. Mild left basilar atelectasis or infiltrate, not significantly changed.    Cardiac Studies:   CABG 10/13/10: LIMA to LAD, SVG to Ramus I and Ramus II, SVG to OM, SVG to PDA Dr Ceasar Mons. On beta blockers chronically  Coronary angiogram 06/17/2014: LIMA to LAD patent, SVG to PDA, SVG to OM1, SVG to ramus occluded.. Severe native vessel disease. Left main 95% stenosis. Medical therapy.  Lexiscan Myoview stress test  04/07/2014: 1. The resting electrocardiogram demonstrated normal sinus rhythm. Inferior infarct, old. Anterior infarct, old. Stress EKG was non diagnostic due to pharmacologic stress. The patient did develop dyspnea and leg pain. The stress test was terminated because of the end of the pharmacological stress. 2. SPECT images demonstrate Medium perfusion abnormality of severe intensity in the basal inferior, basal inferolateral, mid inferoseptal, mid inferior, mid inferolateral and lateral myocardial wall(s) on the stress images. The defect is not present on the resting images consistent with ischemia. The left ventricular ejection fraction was calculated or visually estimated to be 45%. This is an abnormal stress, consider further work up if clinically indicated. This is at least an intermediate stress test.  Echo- 05/01/14: Left ventricle cavity is normal in size. Moderate concentric hypertrophy of the left ventricle. Mild decrease in global wall motion. Mildly depressed systolic function, calculated EF- 50%. Left atrial cavity is mildly dilated. Mild tricuspid regurgitation. No evidence of pulmonary hypertension.  External ([facility]) echocardiogram 05/12/2021: Left Ventricle: Left ventricle is mildly dilated.    Left Ventricle: Systolic function is moderately abnormal. EF: 30-35%.    Right Ventricle: Right ventricle is mildly dilated.    Right Ventricle: Systolic function is moderately reduced. Abnormal  tricuspid annular plane systolic excursion (TAPSE) <1.7 cm.    Tricuspid Valve: There is mild regurgitation.    Tricuspid Valve: The right ventricular systolic pressure is moderately  elevated (50-59 mmHg).  Pacemaker   Scheduled  In office pacemaker check 07/30/20  Single (S)/Dual (D)/BV: D. Presenting APVS. Pacemaker dependant:  Yes. Underlying Junctional @ 42/min. AP 99%, VP 2%. AMS Episodes 0.  HVR 1. Longest 7 beats NSVT. Latest 02/17/2020. Longevity 3.5 Years. Magnet rate: >85%. Lead  measurements: Stable A lead measurements 0.5V @ 0.4 mS. V lead measurement high 5.6V at 65mS and Impedance 1569 Ohm. Bipolar.  Histogram: Low (L)/normal (N)/high (H)  Normal.  Patient activity Normal.   Observations: Normal pacemaker function. Suspect lead degeneration due to age (62 years). Changes: Changed V lead polarity to Unipolar pace and Bipolar sense. MVP on.   EKG:  ***  External EKG 05/11/2021: Atrial paced rhythm  Left axis deviation  Nonspecific intraventricular conduction delay  Nonspecific T wave abnormality  Abnormal ECG   EKG 08/01/2018: A paced rhythm with first-degree AV block, inferior infarct old, anteroseptal infarct old, borderline criteria for LVH. PVC. Nonspecific T abnormality. No significant change from EKG 07/27/2017  Assessment   No diagnosis found.  No orders of the defined types were placed in this encounter.  There are no discontinued medications.   Recommendations:   Roger Young  is a 80 y.o. 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 Coronary angiogram performed on 06/17/2014 showed "severe native vessel disease with no lesion amenable to PCI". ASCVD risk factors include: diabetes, hyperlipidemia and hypertension. 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 He was on long-term anticoagulation and stopped it per patient preference for the past on year.   Patient was admitted to [facility] hospital 05/11/2021 - 0/98/2867 for acute systolic heart failure with bilateral pleural effusions and new LV systolic dysfunction with LVEF of 35%.  Patient was diuresed and started on Entresto, Lasix, aspirin, Jardiance, and continued metoprolol.  During hospitalization patient developed delirium likely secondary to mild alcohol withdrawal.  Notably providers at [place] and starting spironolactone given mild  rise in creatinine and deferred this to our office.  He now presents for follow up. ***  ***  ***  Patient is seen for annual visit and follow-up, I reviewed the results of the pacemaker interrogation, bipolar lead changed over to a unipolar lead with regard to ventricular pacing with excellent capture.  I reviewed his chest x-ray, there is no lead fracture.  Suspect old lead with degeneration in the coating of the lead. Fortunately although pacemaker dependent on the a lead, not dependent on the V lead.  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  Blood pressure is elevated, I have restarted metoprolol XL 50 mg daily and increased his lisinopril from 5 mg to 10 mg daily.  Continued atorvastatin, continue aspirin, advised him to obtain labs.  I would like to see him back in 6 weeks for follow-up.  With regard to decreased memory, neurology referral made.  We will also check A1c from diabetes standpoint as he has discontinued most of his medications.  He has now established with Dr. Guadalupe Maple and will certainly send a copy of my note to him.  This was a 45-minute encounter addressing multiple medical issues in addition to pacemaker check and evaluation.    Alethia Berthold, MD, [facility] 05/20/2021, 8:49 AM Office: [phone]

## 2021-09-23 ENCOUNTER — Encounter: Payer: Medicare Other | Admitting: Cardiology

## 2021-09-23 DIAGNOSIS — I482 Chronic atrial fibrillation, unspecified: Secondary | ICD-10-CM

## 2021-09-23 DIAGNOSIS — Z45018 Encounter for adjustment and management of other part of cardiac pacemaker: Secondary | ICD-10-CM

## 2021-09-23 DIAGNOSIS — Z950 Presence of cardiac pacemaker: Secondary | ICD-10-CM

## 2021-10-26 ENCOUNTER — Encounter: Payer: Medicare Other | Admitting: Cardiology

## 2021-11-08 ENCOUNTER — Telehealth: Payer: Self-pay

## 2021-11-08 NOTE — Telephone Encounter (Signed)
Called and spoke with patient, he said he will transmit in the morning when he gets home.

## 2021-11-11 ENCOUNTER — Ambulatory Visit: Payer: Medicare Other | Admitting: Cardiology

## 2021-11-11 ENCOUNTER — Encounter: Payer: Self-pay | Admitting: Cardiology

## 2021-11-11 ENCOUNTER — Other Ambulatory Visit: Payer: Self-pay

## 2021-11-11 DIAGNOSIS — I495 Sick sinus syndrome: Secondary | ICD-10-CM

## 2021-11-11 DIAGNOSIS — Z950 Presence of cardiac pacemaker: Secondary | ICD-10-CM

## 2021-11-11 DIAGNOSIS — Z45018 Encounter for adjustment and management of other part of cardiac pacemaker: Secondary | ICD-10-CM

## 2021-11-11 NOTE — Progress Notes (Signed)
Chief Complaint  Patient presents with   Pacemaker Check    Encounter for care of pacemaker  Pacemaker [employer]onic [id number]  Sinus node dysfunction (HCC)  Scheduled  In office pacemaker check 11/11/21  Single (S)/Dual (D)/BV: D. Presenting APVS. Pacemaker dependant:  No. Underlying SB 41/min. AP 93%, VP 3%.  AMS Episodes 7.  AT/AF burden <01% . Longest 40 min, EGM AT/AF. Latest 10/09/2021. HVR 2, NSVT for 5 Sec. 1 HVR AT/AF Longevity 2.9  Years. Magnet rate: >85%. Lead measurements: Stable. Histogram: Low (L)/normal (N)/high (H)  Good and normal response.   Observations: Normal pacemaker function. Changes: None.   Roger Decamp, MD, [facility] 11/11/2021, 1:47 PM Office: [phone] Fax: [phone] Pager: [phone]

## 2021-12-02 ENCOUNTER — Other Ambulatory Visit: Payer: Self-pay | Admitting: Cardiology

## 2021-12-02 DIAGNOSIS — E7800 Pure hypercholesterolemia, unspecified: Secondary | ICD-10-CM

## 2022-02-09 ENCOUNTER — Encounter: Payer: Self-pay | Admitting: Cardiology

## 2022-02-09 ENCOUNTER — Ambulatory Visit: Payer: Medicare Other | Admitting: Cardiology

## 2022-02-09 VITALS — BP 136/80 | HR 61 | Temp 98.7°F | Resp 16 | Ht 66.0 in | Wt 187.8 lb

## 2022-02-09 DIAGNOSIS — I10 Essential (primary) hypertension: Secondary | ICD-10-CM

## 2022-02-09 DIAGNOSIS — I5022 Chronic systolic (congestive) heart failure: Secondary | ICD-10-CM

## 2022-02-09 DIAGNOSIS — I2510 Atherosclerotic heart disease of native coronary artery without angina pectoris: Secondary | ICD-10-CM

## 2022-02-09 DIAGNOSIS — R0609 Other forms of dyspnea: Secondary | ICD-10-CM

## 2022-02-09 DIAGNOSIS — Z950 Presence of cardiac pacemaker: Secondary | ICD-10-CM

## 2022-02-09 NOTE — Progress Notes (Signed)
? ?Primary Physician/Referring:  Guadalupe Maple, MD ? ?Patient ID: Roger Young, male    DOB: 1941/07/07, 81 y.o.   MRN: [id number] ? ?Chief Complaint  ?Patient presents with  ? Coronary Artery Disease  ? Atrial Fibrillation  ? Hyperlipidemia  ? Follow-up  ?  3 months  ? ?HPI:   ? ?Roger Young  is a 81 y.o. 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 Coronary angiogram performed on 06/17/2014 showed "severe native vessel disease with no lesion amenable to PCI". ASCVD risk factors include: diabetes, hyperlipidemia and hypertension. 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 He is presently was on long-term anticoagulation and stopped it per patient preference for the past on year (Since 2020).  ? ?Patient is seen for annual visit and follow-up, he has noticed worsening dyspnea on exertion  His son is present.  No chest pain, dizziness or palpitations.  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 ? ?Past Medical History:  ?Diagnosis Date  ? Back pain   ? lower back with pain shooting down right leg  ? CHF (congestive heart failure) ([place])   ? Chronic ischemic heart disease, unspecified   ? Encounter for care of pacemaker 05/11/2019  ? Essential hypertension, benign   ? History of kidney stones   ? Other heart block   ? Other testicular hypofunction   ? Sinus node dysfunction ([place]) 05/11/2019  ? Type II or unspecified type diabetes mellitus without mention of complication, not stated as uncontrolled   ? ?Social History  ? ?Tobacco Use  ? Smoking status: Never  ? Smokeless tobacco: Current  ?  Types: Chew  ? Tobacco comments:  ?  Advised to quit.  ?Substance Use Topics  ? Alcohol use: Yes  ?  Alcohol/week: 0.0 - 1.0 standard drinks  ?  Comment: rare  ?Marital Status: Legally Separated   ? ?ROS  ?Review of Systems  ?Cardiovascular:  Negative for chest pain, dyspnea on exertion and leg swelling.  ?Gastrointestinal:  Negative for melena.  ?Psychiatric/Behavioral:  Positive for memory loss.   ?Objective  ? ? ?  02/09/2022  ?  9:00 AM 02/09/2022  ?  8:48 AM 07/30/2020  ?  9:57 AM  ?Vitals with BMI  ?Height  5' 6"   ?Weight  187 lbs 13 oz   ?BMI  30.33   ?Systolic 063 016 010  ?Diastolic 80 92 88  ?Pulse 61 55   ?  ?Blood pressure 136/80, pulse 61, temperature 98.7 ?F (37.1 ?C), temperature source Temporal, resp. rate 16, height 5' 6" (1.676 m), weight 187 lb 12.8 oz (85.2 kg), SpO2 94 %. Body mass index is 30.31 kg/m?. ?  ?Physical Exam ?Neck:  ?   Vascular: No JVD.  ?Cardiovascular:  ?   Rate and Rhythm: Normal rate and regular rhythm.  ?   Pulses: Intact distal pulses.  ?   Heart sounds: Normal heart sounds. No murmur heard. ?  No gallop.  ?Pulmonary:  ?   Effort: Pulmonary effort is normal.  ?   Breath sounds: Normal breath sounds.  ?Abdominal:  ?   General: Bowel sounds are normal.  ?   Palpations: Abdomen is soft.  ?Musculoskeletal:  ?   Right lower leg: No edema.  ?   Left lower leg: No edema.  ? ?Radiology: ?No results found. ? ? ?  Laboratory examination:  ? ?No results for input(s): NA, K, CL, CO2, GLUCOSE, BUN, CREATININE, CALCIUM, GFRNONAA, GFRAA in the last 8760 hours. ? ?  Latest Ref Rng & Units 07/30/2020  ? 10:30 AM 04/14/2017  ?  3:36 AM 04/07/2017  ?  7:43 AM  ?CMP  ?Glucose 65 - 99 mg/dL 178   283   329    ?BUN 8 - 27 mg/dL _0 ?Creatinine 0.76 - 1.27 mg/dL 1.04   1.39   1.04    ?Sodium 134 - 144 mmol/L 131   136   136    ?Potassium 3.5 - 5.2 mmol/L 4.6   3.7   4.3    ?Chloride 96 - 106 mmol/L 96   97   100    ?CO2 20 - 29 mmol/L _1 ?Calcium 8.6 - 10.2 mg/dL 9.8   10.2   9.8    ?Total Protein 6.0 - 8.5 g/dL 7.2      ?Total Bilirubin 0.0 - 1.2 mg/dL 0.7      ?Alkaline Phos 44 - 121 IU/L 61      ?AST 0 - 40 IU/L 26      ?ALT 0 - 44 IU/L 16      ? ? ?  Latest Ref Rng &  Units 07/30/2020  ? 10:30 AM 04/14/2017  ?  3:36 AM 04/07/2017  ?  7:43 AM  ?CBC  ?WBC 3.4 - 10.8 x10E3/uL 9.1   11.2   7.6    ?Hemoglobin 13.0 - 17.7 g/dL 14.2   13.5   15.5    ?Hematocrit 37.5 - 51.0 % 42.6   39.0   46.2    ?Platelets 150 - 450 x10E3/uL 210   320   214    ? ?Lipid Panel  ?   ?Component Value Date/Time  ? CHOL 145 07/30/2020 1030  ? TRIG 218 (H) 07/30/2020 1030  ? HDL 30 (L) 07/30/2020 1030  ? Stem 78 07/30/2020 1030  ? ?HEMOGLOBIN A1C ?Lab Results  ?Component Value Date  ? HGBA1C 9.7 (H) 07/30/2020  ? MPG 237 04/04/2017  ? ?TSH ?No results for input(s): TSH in the last 8760 hours.  ? ?External labs: ? ?02/07/2018: Creatinine 1.1, EGFR 65/75, potassium 4.6, calcium 10.7, CMP normal. Cholesterol 126, triglycerides 110, HDL 36, LDL 68. ? ?Medications  ? ? ?Current Outpatient Medications:  ?  aspirin (ASPIRIN CHILDRENS) 81 MG chewable tablet, Chew 1 tablet (81 mg total) by mouth daily., Disp: 90 tablet, Rfl: 3 ?  atorvastatin (LIPITOR) 40 MG tablet, TAKE 1 TABLET BY MOUTH EVERY NIGHT AT BEDTIME FOR CHOLESTEROL, Disp: 90 tablet, Rfl: 3 ?  memantine (NAMENDA) 5 MG tablet, Take 1 tablet by mouth daily., Disp: , Rfl:  ?  metFORMIN (GLUCOPHAGE) 1000 MG tablet, Take 1 tablet (1,000 mg total) by mouth 2 (two) times daily with a meal., Disp: , Rfl:  ?  potassium chloride (KLOR-CON M) 10 MEQ tablet, Take 1 tablet by mouth daily., Disp: , Rfl:  ?  sacubitril-valsartan (ENTRESTO) 24-26 MG, Take 1 tablet by mouth in the morning and at bedtime., Disp: , Rfl:  ?  thiamine 100 MG tablet, Take 1 tablet by mouth daily., Disp: , Rfl:   ?  ?Cardiac Studies:  ? ?CABG 10/13/10: LIMA to LAD, SVG to Ramus I and Ramus II, SVG to OM, SVG to PDA Dr Ceasar Mons. On beta blockers chronically ? ?  Coronary angiogram 06/17/2014: LIMA to LAD patent, SVG to PDA, SVG to OM1, SVG to ramus occluded.. Severe native vessel disease. Left main 95% stenosis. Medical therapy. ? ?Lexiscan Myoview stress test 04/07/2014: ?1. The resting  electrocardiogram demonstrated normal sinus rhythm. Inferior infarct, old. Anterior infarct, old. Stress EKG was non diagnostic due to pharmacologic stress. The patient did develop dyspnea and leg pain. The stress test was terminated because of the end of the pharmacological stress. ?2. SPECT images demonstrate Medium perfusion abnormality of severe intensity in the basal inferior, basal inferolateral, mid inferoseptal, mid inferior, mid inferolateral and lateral myocardial wall(s) on the stress images. The defect is not present on the resting images consistent with ischemia. The left ventricular ejection fraction was calculated or visually estimated to be 45%. This is an abnormal stress, consider further work up if clinically indicated. This is at least an intermediate stress test. ? ?Echo- 05/01/14: Left ventricle cavity is normal in size. Moderate concentric hypertrophy of the left ventricle. Mild decrease in global wall motion. Mildly depressed systolic function, calculated EF- 50%. Left atrial cavity is mildly dilated. Mild tricuspid regurgitation. No evidence of pulmonary hypertension. ? ?Echocardiogram 05/13/2019:  ?Left Ventricle: Left ventricle is mildly dilated.  ?  Left Ventricle: Systolic function is moderately abnormal. EF: 30-35%.  ?  Right Ventricle: Right ventricle is mildly dilated.  ?  Right Ventricle: Systolic function is moderately reduced. Abnormal  ?tricuspid annular plane systolic excursion (TAPSE) <1.7 cm.  ?  Tricuspid Valve: There is mild regurgitation.  ?  Tricuspid Valve: The right ventricular systolic pressure is moderately  ?elevated (50-59 mmHg). ? ?EKG:  ? ?Remote dual-chamber pacemaker transmission 01/31/2022: ?AP 97%, VP 5%, longevity 2 years and 11 months. Lead impedance and thresholds within normal limits. There were no mode switches, no high ventricular rate episodes. Normal pacemaker function. ? ?Scheduled  In office pacemaker check 11/11/21  ?Single (S)/Dual (D)/BV:  D. ?Presenting APVS. ?Pacemaker dependant:  No. Underlying SB 41/min. AP 93%, VP 3%.  ?AMS Episodes 7.  AT/AF burden <01% . Longest 40 min, EGM AT/AF. Latest 10/09/2021. ?HVR 2, NSVT for 5 Sec. 1 HVR AT/AF ?Longevity 2.9  Years. Ma

## 2022-02-22 ENCOUNTER — Ambulatory Visit: Payer: Medicare Other

## 2022-02-22 DIAGNOSIS — R0609 Other forms of dyspnea: Secondary | ICD-10-CM

## 2022-02-22 DIAGNOSIS — I2510 Atherosclerotic heart disease of native coronary artery without angina pectoris: Secondary | ICD-10-CM

## 2022-02-22 DIAGNOSIS — I5022 Chronic systolic (congestive) heart failure: Secondary | ICD-10-CM

## 2022-03-30 ENCOUNTER — Ambulatory Visit: Payer: Medicare Other | Admitting: Cardiology

## 2022-03-30 NOTE — Progress Notes (Deleted)
Primary Physician/Referring:  Guadalupe Maple, MD  Patient ID: Roger Young, male    DOB: 1940/12/28, 81 y.o.   MRN: [id number]  No chief complaint on file.  HPI:    Roger Young  is a 81 y.o. 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 Coronary angiogram performed on 06/17/2014 showed "severe native vessel disease with no lesion amenable to PCI". ASCVD risk factors include: diabetes, hyperlipidemia and hypertension. 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 He is presently was on long-term anticoagulation and stopped it per patient preference for the past on year (Since 2020).   Patient is seen for annual visit and follow-up, he has noticed worsening dyspnea on exertion  His son is present.  No chest pain, dizziness or palpitations.  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  Past Medical History:  Diagnosis Date   Back pain    lower back with pain shooting down right leg   CHF (congestive heart failure) (HCC)    Chronic ischemic heart disease, unspecified    Encounter for care of pacemaker 05/11/2019   Essential hypertension, benign    History of kidney stones    Other heart block    Other testicular hypofunction    Sinus node dysfunction ([place]) 05/11/2019   Type II or unspecified type diabetes mellitus without mention of complication, not stated as uncontrolled    Social History   Tobacco Use   Smoking status: Never   Smokeless tobacco: Current    Types: Chew   Tobacco comments:    Advised to quit.  Substance Use Topics   Alcohol use: Yes    Alcohol/week: 0.0 - 1.0 standard drinks    Comment: rare  Marital Status: Legally Separated   ROS  Review of Systems  Cardiovascular:  Negative for chest pain, dyspnea on exertion and leg swelling.   Gastrointestinal:  Negative for melena.  Psychiatric/Behavioral:  Positive for memory loss.   Objective      02/09/2022    9:00 AM 02/09/2022    8:48 AM 07/30/2020    9:57 AM  Vitals with BMI  Height  _0    Weight  187 lbs 13 oz   BMI  65.78   Systolic 469 629 528  Diastolic 80 92 88  Pulse 61 55     There were no vitals taken for this visit. There is no height or weight on file to calculate BMI.   Physical Exam Neck:     Vascular: No JVD.  Cardiovascular:     Rate and Rhythm: Normal rate and regular rhythm.     Pulses: Intact distal pulses.     Heart sounds: Normal heart sounds. No murmur heard.   No gallop.  Pulmonary:     Effort: Pulmonary effort is normal.     Breath sounds: Normal breath sounds.  Abdominal:     General: Bowel sounds are normal.     Palpations: Abdomen is soft.  Musculoskeletal:     Right lower leg: No edema.     Left lower leg: No edema.   Radiology: No results found.   Laboratory examination:   No results for input(s): NA, K, CL, CO2, GLUCOSE, BUN, CREATININE, CALCIUM, GFRNONAA, GFRAA in the last 8760 hours.    Latest Ref Rng & Units 07/30/2020   10:30 AM 04/14/2017  3:36 AM 04/07/2017    7:43 AM  CMP  Glucose 65 - 99 mg/dL 178   283   329    BUN 8 - 27 mg/dL _0 Creatinine 0.76 - 1.27 mg/dL 1.04   1.39   1.04    Sodium 134 - 144 mmol/L 131   136   136    Potassium 3.5 - 5.2 mmol/L 4.6   3.7   4.3    Chloride 96 - 106 mmol/L 96   97   100    CO2 20 - 29 mmol/L _1 Calcium 8.6 - 10.2 mg/dL 9.8   10.2   9.8    Total Protein 6.0 - 8.5 g/dL 7.2      Total Bilirubin 0.0 - 1.2 mg/dL 0.7      Alkaline Phos 44 - 121 IU/L 61      AST 0 - 40 IU/L 26      ALT 0 - 44 IU/L 16          Latest Ref Rng & Units 07/30/2020   10:30 AM 04/14/2017    3:36 AM 04/07/2017    7:43 AM  CBC  WBC 3.4 - 10.8 x10E3/uL 9.1   11.2   7.6    Hemoglobin 13.0 - 17.7 g/dL 14.2   13.5   15.5    Hematocrit 37.5 - 51.0 % 42.6   39.0   46.2     Platelets 150 - 450 x10E3/uL 210   320   214     Lipid Panel     Component Value Date/Time   CHOL 145 07/30/2020 1030   TRIG 218 (H) 07/30/2020 1030   HDL 30 (L) 07/30/2020 1030   LDLCALC 78 07/30/2020 1030   HEMOGLOBIN A1C Lab Results  Component Value Date   HGBA1C 9.7 (H) 07/30/2020   MPG 237 04/04/2017   TSH No results for input(s): TSH in the last 8760 hours.   External labs:  02/07/2018: Creatinine 1.1, EGFR 65/75, potassium 4.6, calcium 10.7, CMP normal. Cholesterol 126, triglycerides 110, HDL 36, LDL 68.  Medications    Current Outpatient Medications:    aspirin (ASPIRIN CHILDRENS) 81 MG chewable tablet, Chew 1 tablet (81 mg total) by mouth daily., Disp: 90 tablet, Rfl: 3   atorvastatin (LIPITOR) 40 MG tablet, TAKE 1 TABLET BY MOUTH EVERY NIGHT AT BEDTIME FOR CHOLESTEROL, Disp: 90 tablet, Rfl: 3   memantine (NAMENDA) 5 MG tablet, Take 1 tablet by mouth daily., Disp: , Rfl:    metFORMIN (GLUCOPHAGE) 1000 MG tablet, Take 1 tablet (1,000 mg total) by mouth 2 (two) times daily with a meal., Disp: , Rfl:    potassium chloride (KLOR-CON M) 10 MEQ tablet, Take 1 tablet by mouth daily., Disp: , Rfl:    sacubitril-valsartan (ENTRESTO) 24-26 MG, Take 1 tablet by mouth in the morning and at bedtime., Disp: , Rfl:    thiamine 100 MG tablet, Take 1 tablet by mouth daily., Disp: , Rfl:     Cardiac Studies:   CABG 10/13/10: LIMA to LAD, SVG to Ramus I and Ramus II, SVG to OM, SVG to PDA Dr Ceasar Mons. On beta blockers chronically  Coronary angiogram 06/17/2014: LIMA to LAD patent, SVG to PDA, SVG to OM1, SVG to ramus occluded.. Severe native vessel disease. Left main 95% stenosis. Medical therapy.  Lexiscan Myoview stress test 04/07/2014: 1. The resting electrocardiogram demonstrated normal sinus  rhythm. Inferior infarct, old. Anterior infarct, old. Stress EKG was non diagnostic due to pharmacologic stress. The patient did develop dyspnea and leg pain. The stress test was terminated  because of the end of the pharmacological stress. 2. SPECT images demonstrate Medium perfusion abnormality of severe intensity in the basal inferior, basal inferolateral, mid inferoseptal, mid inferior, mid inferolateral and lateral myocardial wall(s) on the stress images. The defect is not present on the resting images consistent with ischemia. The left ventricular ejection fraction was calculated or visually estimated to be 45%. This is an abnormal stress, consider further work up if clinically indicated. This is at least an intermediate stress test.  Echo- 05/01/14: Left ventricle cavity is normal in size. Moderate concentric hypertrophy of the left ventricle. Mild decrease in global wall motion. Mildly depressed systolic function, calculated EF- 50%. Left atrial cavity is mildly dilated. Mild tricuspid regurgitation. No evidence of pulmonary hypertension.  PCV MYOCARDIAL PERFUSION WITH LEXISCAN 02/22/2022  Lexiscan nuclear stress test performed using 1-day protocol. SPECT images show small sized, mild intensity, reversible perfusion defect in apical anterior myocardium; and anterior apex, and a medium sized, medium intensity, partly reversible perfusion defect in apical to basal inferior/ inferolateral myocardium. There is inferolateral dyskinesis with severe hypokinesis of rest of the myocardium. Stress LVEF 22%. High risk study.   PCV ECHOCARDIOGRAM COMPLETE 02/22/2022  Severely depressed LV systolic function with visual EF 25-30%. Left ventricle cavity is upper limit of normal. Moderate left ventricular hypertrophy. Left ventricle illustrates both global and regional wall motion abnormalities (RWMA): Basal anteroseptal, Basal inferior, Basal inferoseptal, Mid inferoseptal, Apical inferior and Apical septal hypokinesis. Unable to evaluate diastolic function due to paced rhythm. Elevated LAP. Visually, right ventricle cavity is dilated and systolic function is reduced. Endocardial wires noted within  the right cardiac chambers. Native trileaflet aortic valve with no regurgitation. Aortic valve sclerosis without stenosis. Mild (Grade I) mitral regurgitation. Compared to 05/13/2019 LVEF reduced from 30-35% to 25-30%, RWMA noted to new compared to prior study, Mild MR is new, moderate PHTN per RVSP is now within normal limits, endocardial wires are new finding.  RWMA could be secondary to either paced rhythm or CAD and degree of low-flow low gradient aortic stenosis cannot be rule out. Clinical correlation required.     EKG:   Remote dual-chamber pacemaker transmission 01/31/2022: AP 97%, VP 5%, longevity 2 years and 11 months. Lead impedance and thresholds within normal limits. There were no mode switches, no high ventricular rate episodes. Normal pacemaker function.  Scheduled  In office pacemaker check 11/11/21  Single (S)/Dual (D)/BV: D. Presenting APVS. Pacemaker dependant:  No. Underlying SB 41/min. AP 93%, VP 3%.  AMS Episodes 7.  AT/AF burden <01% . Longest 40 min, EGM AT/AF. Latest 10/09/2021. HVR 2, NSVT for 5 Sec. 1 HVR AT/AF Longevity 2.9  Years. Magnet rate: >85%. Lead measurements: Stable. Histogram: Low (L)/normal (N)/high (H)  Good and normal response.    Observations: Normal pacemaker function. Changes: None.  EKG 02/09/2022: Atrially paced and ventricular sensed rhythm at rate of 63 bpm with first-degree AV block.  Left axis deviation, left anterior fascicular block.  LVH.  Nonspecific T abnormality.  EKG 08/01/2018: A paced rhythm with first-degree AV block, inferior infarct old, anteroseptal infarct old, borderline criteria for LVH. PVC. Nonspecific T abnormality. No significant change from EKG 07/27/2017  Assessment     ICD-10-CM   1. Coronary artery disease involving native coronary artery of native heart without angina pectoris  I25.10  2. Chronic systolic heart failure (HCC)  I50.22     3. Primary hypertension  I10     4. Pacemaker [employer] ADRL1  Z95.0       No orders of the defined types were placed in this encounter.  There are no discontinued medications.   Recommendations:   ABDIMALIK MAYORQUIN  is a 81 y.o. 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 Coronary angiogram performed on 06/17/2014 showed "severe native vessel disease with no lesion amenable to PCI". ASCVD risk factors include: diabetes, hyperlipidemia and hypertension. 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 He is presently was on long-term anticoagulation and stopped it per patient preference for the past on year (Since 2020).   Patient is seen for annual visit and follow-up, he has noticed worsening dyspnea on exertion, I reviewed his echocardiogram that was done by his PCP, he has new onset cardiomyopathy with severe LV systolic dysfunction and also moderate to severe pulmonary hypertension as well.  Repeat echocardiogram and schedule him for a Lexiscan nuclear stress test.  Patient unable to exercise due to gait instability.  I need to review his labs, I was not able to access his labs from his PCP, I have recommended that we obtain NT proBNP along with CBC, CMP and TSH, they are seeing their PCP tomorrow.  I would like to uptitrate his Delene Loll and also his cardiac medications.  Consult like to see him back in 6 weeks for follow-up.  I also hear very coarse rhonchi in his lungs right base worse than the left, I reviewed his external chest x-ray had shown evidence of scarring.  I suspect he probably may be developing interstitial lung disease/pulmonary fibrosis.  I would recommend that we repeat another chest x-ray through his PCP and consider high-resolution CT scan as well.  I spent 40 minutes with the patient and his family discussing and review of new changes that were evident on his x-ray, echocardiogram and  discussion regarding progression of CAD.  I will also obtain labs for our records as well.     His pacemaker is functioning normally.  He is maintaining sinus rhythm.  He has no developed early dementia.  He is accompanied by his son and his wife.    Adrian Prows, MD, [facility] 03/30/2022, 6:56 AM Office: [phone]

## 2022-05-24 DEATH — deceased
